# Patient Record
Sex: Female | Born: 1975 | Race: White | Hispanic: No | Smoking: Current some day smoker
Health system: Southern US, Community
[De-identification: ages and names within clinical notes are randomized; demographics above are authoritative.]

## PROBLEM LIST (undated history)

## (undated) DIAGNOSIS — I10 Essential (primary) hypertension: Secondary | ICD-10-CM

## (undated) DIAGNOSIS — F419 Anxiety disorder, unspecified: Secondary | ICD-10-CM

## (undated) HISTORY — DX: Essential (primary) hypertension: I10

---

## 1998-02-28 ENCOUNTER — Inpatient Hospital Stay (HOSPITAL_COMMUNITY): Admission: AD | Admit: 1998-02-28 | Discharge: 1998-02-28 | Payer: Self-pay | Admitting: *Deleted

## 1998-03-24 ENCOUNTER — Other Ambulatory Visit: Admission: RE | Admit: 1998-03-24 | Discharge: 1998-03-24 | Payer: Self-pay | Admitting: Obstetrics and Gynecology

## 1998-08-10 ENCOUNTER — Ambulatory Visit (HOSPITAL_COMMUNITY): Admission: RE | Admit: 1998-08-10 | Discharge: 1998-08-10 | Payer: Self-pay | Admitting: Obstetrics and Gynecology

## 1998-09-29 ENCOUNTER — Inpatient Hospital Stay (HOSPITAL_COMMUNITY): Admission: AD | Admit: 1998-09-29 | Discharge: 1998-09-29 | Payer: Self-pay | Admitting: Obstetrics and Gynecology

## 1998-09-30 ENCOUNTER — Inpatient Hospital Stay (HOSPITAL_COMMUNITY): Admission: AD | Admit: 1998-09-30 | Discharge: 1998-09-30 | Payer: Self-pay | Admitting: Obstetrics and Gynecology

## 1998-10-01 ENCOUNTER — Inpatient Hospital Stay (HOSPITAL_COMMUNITY): Admission: AD | Admit: 1998-10-01 | Discharge: 1998-10-04 | Payer: Self-pay | Admitting: Obstetrics and Gynecology

## 1999-05-09 ENCOUNTER — Inpatient Hospital Stay (HOSPITAL_COMMUNITY): Admission: AD | Admit: 1999-05-09 | Discharge: 1999-05-09 | Payer: Self-pay | Admitting: Obstetrics and Gynecology

## 1999-05-14 ENCOUNTER — Other Ambulatory Visit: Admission: RE | Admit: 1999-05-14 | Discharge: 1999-05-14 | Payer: Self-pay | Admitting: Obstetrics and Gynecology

## 1999-06-25 ENCOUNTER — Ambulatory Visit (HOSPITAL_COMMUNITY): Admission: RE | Admit: 1999-06-25 | Discharge: 1999-06-25 | Payer: Self-pay | Admitting: Obstetrics and Gynecology

## 1999-06-25 ENCOUNTER — Encounter: Payer: Self-pay | Admitting: Obstetrics and Gynecology

## 1999-10-31 ENCOUNTER — Inpatient Hospital Stay (HOSPITAL_COMMUNITY): Admission: AD | Admit: 1999-10-31 | Discharge: 1999-10-31 | Payer: Self-pay | Admitting: Obstetrics and Gynecology

## 1999-11-07 ENCOUNTER — Inpatient Hospital Stay (HOSPITAL_COMMUNITY): Admission: AD | Admit: 1999-11-07 | Discharge: 1999-11-07 | Payer: Self-pay | Admitting: Obstetrics and Gynecology

## 1999-11-15 ENCOUNTER — Inpatient Hospital Stay (HOSPITAL_COMMUNITY): Admission: AD | Admit: 1999-11-15 | Discharge: 1999-11-17 | Payer: Self-pay | Admitting: Obstetrics and Gynecology

## 2002-04-06 ENCOUNTER — Encounter: Payer: Self-pay | Admitting: Emergency Medicine

## 2002-04-06 ENCOUNTER — Emergency Department (HOSPITAL_COMMUNITY): Admission: EM | Admit: 2002-04-06 | Discharge: 2002-04-06 | Payer: Self-pay | Admitting: Emergency Medicine

## 2002-09-26 ENCOUNTER — Emergency Department (HOSPITAL_COMMUNITY): Admission: EM | Admit: 2002-09-26 | Discharge: 2002-09-26 | Payer: Self-pay | Admitting: Emergency Medicine

## 2004-02-14 ENCOUNTER — Emergency Department (HOSPITAL_COMMUNITY): Admission: EM | Admit: 2004-02-14 | Discharge: 2004-02-14 | Payer: Self-pay | Admitting: Emergency Medicine

## 2004-12-18 ENCOUNTER — Emergency Department (HOSPITAL_COMMUNITY): Admission: EM | Admit: 2004-12-18 | Discharge: 2004-12-18 | Payer: Self-pay | Admitting: Family Medicine

## 2005-03-11 ENCOUNTER — Emergency Department (HOSPITAL_COMMUNITY): Admission: EM | Admit: 2005-03-11 | Discharge: 2005-03-11 | Payer: Self-pay | Admitting: Family Medicine

## 2005-05-19 ENCOUNTER — Emergency Department (HOSPITAL_COMMUNITY): Admission: EM | Admit: 2005-05-19 | Discharge: 2005-05-19 | Payer: Self-pay | Admitting: *Deleted

## 2005-08-05 ENCOUNTER — Other Ambulatory Visit: Admission: RE | Admit: 2005-08-05 | Discharge: 2005-08-05 | Payer: Self-pay | Admitting: Obstetrics and Gynecology

## 2005-08-26 ENCOUNTER — Emergency Department (HOSPITAL_COMMUNITY): Admission: EM | Admit: 2005-08-26 | Discharge: 2005-08-26 | Payer: Self-pay | Admitting: Podiatry

## 2005-12-02 ENCOUNTER — Emergency Department (HOSPITAL_COMMUNITY): Admission: EM | Admit: 2005-12-02 | Discharge: 2005-12-03 | Payer: Self-pay | Admitting: Emergency Medicine

## 2006-01-15 ENCOUNTER — Emergency Department (HOSPITAL_COMMUNITY): Admission: EM | Admit: 2006-01-15 | Discharge: 2006-01-15 | Payer: Self-pay | Admitting: Emergency Medicine

## 2006-10-02 ENCOUNTER — Emergency Department (HOSPITAL_COMMUNITY): Admission: EM | Admit: 2006-10-02 | Discharge: 2006-10-02 | Payer: Self-pay | Admitting: Emergency Medicine

## 2006-11-06 ENCOUNTER — Emergency Department (HOSPITAL_COMMUNITY): Admission: EM | Admit: 2006-11-06 | Discharge: 2006-11-06 | Payer: Self-pay | Admitting: Emergency Medicine

## 2007-07-03 ENCOUNTER — Encounter: Admission: RE | Admit: 2007-07-03 | Discharge: 2007-07-03 | Payer: Self-pay | Admitting: General Practice

## 2007-07-05 ENCOUNTER — Emergency Department (HOSPITAL_COMMUNITY): Admission: EM | Admit: 2007-07-05 | Discharge: 2007-07-05 | Payer: Self-pay | Admitting: Emergency Medicine

## 2007-08-31 ENCOUNTER — Emergency Department (HOSPITAL_COMMUNITY): Admission: EM | Admit: 2007-08-31 | Discharge: 2007-08-31 | Payer: Self-pay | Admitting: Emergency Medicine

## 2009-03-07 ENCOUNTER — Emergency Department (HOSPITAL_COMMUNITY): Admission: EM | Admit: 2009-03-07 | Discharge: 2009-03-07 | Payer: Self-pay | Admitting: Emergency Medicine

## 2010-01-12 ENCOUNTER — Emergency Department (HOSPITAL_COMMUNITY): Admission: EM | Admit: 2010-01-12 | Discharge: 2010-01-12 | Payer: Self-pay | Admitting: Emergency Medicine

## 2010-03-15 ENCOUNTER — Emergency Department (HOSPITAL_COMMUNITY): Admission: EM | Admit: 2010-03-15 | Discharge: 2010-03-15 | Payer: Self-pay | Admitting: Family Medicine

## 2010-07-19 ENCOUNTER — Emergency Department (HOSPITAL_COMMUNITY)
Admission: EM | Admit: 2010-07-19 | Discharge: 2010-07-19 | Payer: Self-pay | Source: Home / Self Care | Admitting: Emergency Medicine

## 2010-10-08 LAB — URINALYSIS, ROUTINE W REFLEX MICROSCOPIC
Ketones, ur: 15 mg/dL — AB
Specific Gravity, Urine: 1.016 (ref 1.005–1.030)
pH: 5.5 (ref 5.0–8.0)

## 2010-10-08 LAB — URINE MICROSCOPIC-ADD ON

## 2010-10-14 LAB — HEPATIC FUNCTION PANEL
AST: 18 U/L (ref 0–37)
Albumin: 4.1 g/dL (ref 3.5–5.2)
Bilirubin, Direct: 0.1 mg/dL (ref 0.0–0.3)
Total Bilirubin: 1.1 mg/dL (ref 0.3–1.2)
Total Protein: 7.6 g/dL (ref 6.0–8.3)

## 2010-10-14 LAB — URINALYSIS, ROUTINE W REFLEX MICROSCOPIC
Nitrite: NEGATIVE
Specific Gravity, Urine: 1.017 (ref 1.005–1.030)
Urobilinogen, UA: 0.2 mg/dL (ref 0.0–1.0)

## 2010-10-14 LAB — CBC
HCT: 43.3 % (ref 36.0–46.0)
Hemoglobin: 14.4 g/dL (ref 12.0–15.0)
MCHC: 33.3 g/dL (ref 30.0–36.0)
MCV: 97.2 fL (ref 78.0–100.0)
RDW: 13 % (ref 11.5–15.5)
WBC: 11 10*3/uL — ABNORMAL HIGH (ref 4.0–10.5)

## 2010-10-14 LAB — RAPID URINE DRUG SCREEN, HOSP PERFORMED
Amphetamines: NOT DETECTED
Barbiturates: NOT DETECTED
Cocaine: POSITIVE — AB
Tetrahydrocannabinol: POSITIVE — AB

## 2010-10-14 LAB — POCT PREGNANCY, URINE: Preg Test, Ur: NEGATIVE

## 2010-10-14 LAB — BASIC METABOLIC PANEL
CO2: 27 mEq/L (ref 19–32)
Calcium: 9.4 mg/dL (ref 8.4–10.5)
Chloride: 105 mEq/L (ref 96–112)
GFR calc non Af Amer: >60 mL/min (ref >60)
Potassium: 4.2 mEq/L (ref 3.5–5.1)
Sodium: 138 mEq/L (ref 135–145)

## 2010-10-14 LAB — WET PREP, GENITAL
Trich, Wet Prep: NONE SEEN
Yeast Wet Prep HPF POC: NONE SEEN

## 2010-10-14 LAB — GC/CHLAMYDIA PROBE AMP, GENITAL: Chlamydia, DNA Probe: NEGATIVE

## 2010-10-14 LAB — DIFFERENTIAL
Eosinophils Relative: 1 % (ref 0–5)
Neutrophils Relative %: 83 % — ABNORMAL HIGH (ref 43–77)

## 2010-10-14 LAB — URINE MICROSCOPIC-ADD ON

## 2010-10-24 ENCOUNTER — Emergency Department (HOSPITAL_COMMUNITY): Payer: Self-pay

## 2010-10-24 ENCOUNTER — Emergency Department (HOSPITAL_COMMUNITY)
Admission: EM | Admit: 2010-10-24 | Discharge: 2010-10-24 | Disposition: A | Discharge status: Home / Self Care | Payer: Self-pay | Attending: Emergency Medicine | Admitting: Emergency Medicine

## 2010-10-24 DIAGNOSIS — R109 Unspecified abdominal pain: Secondary | ICD-10-CM | POA: Insufficient documentation

## 2010-10-24 LAB — DIFFERENTIAL
Lymphocytes Relative: 24 % (ref 12–46)
Lymphs Abs: 1.7 10*3/uL (ref 0.7–4.0)
Monocytes Absolute: 0.7 10*3/uL (ref 0.1–1.0)
Monocytes Relative: 10 % (ref 3–12)
Neutro Abs: 4.3 10*3/uL (ref 1.7–7.7)
Neutrophils Relative %: 62 % (ref 43–77)

## 2010-10-24 LAB — URINALYSIS, ROUTINE W REFLEX MICROSCOPIC
Bilirubin Urine: NEGATIVE
Ketones, ur: NEGATIVE mg/dL
Protein, ur: NEGATIVE mg/dL
Specific Gravity, Urine: 1.021 (ref 1.005–1.030)
Urobilinogen, UA: 0.2 mg/dL (ref 0.0–1.0)

## 2010-10-24 LAB — COMPREHENSIVE METABOLIC PANEL
ALT: 13 U/L (ref 0–35)
AST: 16 U/L (ref 0–37)
Albumin: 3.4 g/dL — ABNORMAL LOW (ref 3.5–5.2)
Alkaline Phosphatase: 43 U/L (ref 39–117)
Chloride: 104 mEq/L (ref 96–112)
GFR calc non Af Amer: >60 mL/min (ref >60)
Sodium: 139 mEq/L (ref 135–145)
Total Bilirubin: 0.5 mg/dL (ref 0.3–1.2)

## 2010-10-24 LAB — CBC
Hemoglobin: 12.6 g/dL (ref 12.0–15.0)
MCH: 30.9 pg (ref 26.0–34.0)
MCV: 93.6 fL (ref 78.0–100.0)

## 2010-10-24 LAB — POCT PREGNANCY, URINE: Preg Test, Ur: NEGATIVE

## 2010-11-03 LAB — URINALYSIS, ROUTINE W REFLEX MICROSCOPIC
Bilirubin Urine: NEGATIVE
Hgb urine dipstick: NEGATIVE
Nitrite: NEGATIVE
Protein, ur: NEGATIVE mg/dL
Specific Gravity, Urine: 1.027 (ref 1.005–1.030)
Urobilinogen, UA: 0.2 mg/dL (ref 0.0–1.0)

## 2010-11-03 LAB — COMPREHENSIVE METABOLIC PANEL
ALT: 14 U/L (ref 0–35)
AST: 17 U/L (ref 0–37)
CO2: 26 mEq/L (ref 19–32)
Calcium: 9.2 mg/dL (ref 8.4–10.5)
Creatinine, Ser: 1.08 mg/dL (ref 0.4–1.2)
GFR calc Af Amer: >60 mL/min (ref >60)
GFR calc non Af Amer: 59 mL/min — ABNORMAL LOW (ref >60)
Sodium: 137 mEq/L (ref 135–145)
Total Protein: 7.3 g/dL (ref 6.0–8.3)

## 2010-11-03 LAB — DIFFERENTIAL
Eosinophils Relative: 2 % (ref 0–5)
Lymphocytes Relative: 14 % (ref 12–46)
Lymphs Abs: 1.5 10*3/uL (ref 0.7–4.0)
Monocytes Relative: 5 % (ref 3–12)

## 2010-11-03 LAB — GC/CHLAMYDIA PROBE AMP, GENITAL
Chlamydia, DNA Probe: NEGATIVE
GC Probe Amp, Genital: NEGATIVE

## 2010-11-03 LAB — CBC
MCHC: 33.8 g/dL (ref 30.0–36.0)
MCV: 98.5 fL (ref 78.0–100.0)
Platelets: 265 10*3/uL (ref 150–400)
RDW: 13.7 % (ref 11.5–15.5)

## 2010-11-03 LAB — PREGNANCY, URINE: Preg Test, Ur: NEGATIVE

## 2010-12-14 NOTE — Discharge Summary (Signed)
[facility]  Patient:    SORCHA, ROTUNNO                    MRN: [id number] Adm. Date:  [id number] Disc. Date: [id number] Attending:  Oliver Pila                           Discharge Summary  HISTORY OF PRESENT ILLNESS:   A 35 year old white female, para 1-0-2-1, gravida 4, EDC Nov 27, 1999, by last menstrual period compatible with a sonogram at 10 weeks, who presented to labor and delivery with contractions every 10 minutes all day, no leakage of fluid, no vaginal bleeding, and good fetal movement.  PRENATAL LABORATORY DATA:     A positive with a negative antibody, RPR nonreactive, rubella equivocal, hepatitis B surface antigen negative, HIV negative, GC and Chlamydia negative, Group B Strep negative.  One-hour Glucola 83.  PAST OBSTETRIC HISTORY:       Terminations of pregnancy in 1995 and 1997.  In March 2000, spontaneous vaginal delivery 8 pounds 1 ounce.  MEDICATIONS:                  Prenatal vitamins.  PAST GYN HISTORY:             CIN-1 on colposcopic examination.  ALLERGIES:                    PERCOCET caused itching.  PAST MEDICAL HISTORY: PAST SURGICAL HISTORY:        Negative.  PHYSICAL EXAMINATION:         VITAL SIGNS: Normal.  HEART: Normal.  LUNGS: Clear. ABDOMEN: Soft and nontender.  PELVIC: Cervix 4 cm, 80%, vertex -1. Artificial rupture of membranes was done with clear fluid.  HOSPITAL COURSE:              The patient progressed to 8 cm.  She then progressed to full dilatation, pushed two times with a spontaneous vaginal delivery of a vigorous female over intact perineum by Alvino Chapel, M.D., 7 pounds 1  ounce.  Apgars 8 and 9 at one and five minutes.  Placenta was delivered spontaneously.  Estimated blood loss was 300 cc.  Cervix and rectum intact. Postpartum, the patient did well and was discharged on the second postpartum day. Initial hemoglobin 13.3, hematocrit 36.8, and platelet count 173,000,  white count 13,000.  Follow-up hemoglobin 12.1, hematocrit 33.3, white count 21,600, and platelet count 175,000.  RPR nonreactive.  The patients prenatal labs showed an  equivocal rubella.  Hospital plans to offer her the rubella vaccine and the patient will elect to use or not use the rubella vaccine.  FINAL DIAGNOSES:              Intrauterine pregnancy at 38 weeks, delivered.  PROCEDURE:                    Spontaneous vaginal delivery.  CONDITION ON DISCHARGE:       Improved.  DISCHARGE INSTRUCTIONS:       Regular discharge instruction booklet.  Return to the office in six weeks.  She is given a prescription for Darvocet N-100 12 tablets one every six hours as needed for pain with one refill and Motrin 800 mg 20 tablets one every eight hours as needed for pain with one refill. DD:  11/18/99 TD:  11/19/99 Job: [id number]

## 2011-08-06 ENCOUNTER — Emergency Department (HOSPITAL_COMMUNITY)
Admission: EM | Admit: 2011-08-06 | Discharge: 2011-08-06 | Disposition: A | Discharge status: Home / Self Care | Payer: No Typology Code available for payment source | Attending: Emergency Medicine | Admitting: Emergency Medicine

## 2011-08-06 ENCOUNTER — Encounter: Payer: Self-pay | Admitting: Emergency Medicine

## 2011-08-06 ENCOUNTER — Emergency Department (HOSPITAL_COMMUNITY): Payer: No Typology Code available for payment source

## 2011-08-06 DIAGNOSIS — S7011XA Contusion of right thigh, initial encounter: Secondary | ICD-10-CM

## 2011-08-06 DIAGNOSIS — M25569 Pain in unspecified knee: Secondary | ICD-10-CM | POA: Insufficient documentation

## 2011-08-06 DIAGNOSIS — R0789 Other chest pain: Secondary | ICD-10-CM

## 2011-08-06 DIAGNOSIS — R51 Headache: Secondary | ICD-10-CM | POA: Insufficient documentation

## 2011-08-06 DIAGNOSIS — M79609 Pain in unspecified limb: Secondary | ICD-10-CM | POA: Insufficient documentation

## 2011-08-06 DIAGNOSIS — R071 Chest pain on breathing: Secondary | ICD-10-CM | POA: Insufficient documentation

## 2011-08-06 DIAGNOSIS — S7010XA Contusion of unspecified thigh, initial encounter: Secondary | ICD-10-CM | POA: Insufficient documentation

## 2011-08-06 DIAGNOSIS — M7989 Other specified soft tissue disorders: Secondary | ICD-10-CM | POA: Insufficient documentation

## 2011-08-06 MED ORDER — KETOROLAC TROMETHAMINE 60 MG/2ML IM SOLN
60.0000 mg | Freq: Once | INTRAMUSCULAR | Status: AC
  Administered 2011-08-06: 60 mg via INTRAMUSCULAR
  Filled 2011-08-06: qty 2

## 2011-08-06 MED ORDER — OXYCODONE-ACETAMINOPHEN 5-325 MG PO TABS
2.0000 | ORAL_TABLET | Freq: Once | ORAL | Status: AC
  Administered 2011-08-06: 2 via ORAL
  Filled 2011-08-06: qty 2

## 2011-08-06 MED ORDER — HYDROCODONE-ACETAMINOPHEN 5-325 MG PO TABS: 1.0000 | ORAL_TABLET | ORAL | Status: DC | PRN

## 2011-08-06 NOTE — ED Provider Notes (Deleted)
6:44 PM Patient not found in room.  Nursing staff checked in waiting room, patient not found.  May have eloped.  Jimmye Norman, NP 08/06/11 1844
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
## 2011-08-06 NOTE — ED Notes (Signed)
No answer x1
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
## 2011-08-06 NOTE — ED Provider Notes (Signed)
Medical screening examination/treatment/procedure(s) were performed by non-physician practitioner and as supervising physician I was immediately available for consultation/collaboration.   Cassiel Fernandez, MD 08/06/11 2044 
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
## 2011-08-07 NOTE — ED Provider Notes (Signed)
Medical screening examination/treatment/procedure(s) were performed by non-physician practitioner and as supervising physician I was immediately available for consultation/collaboration.   Dione Booze, MD 08/07/11 [phone]

## 2011-08-11 ENCOUNTER — Emergency Department (HOSPITAL_COMMUNITY): Payer: No Typology Code available for payment source

## 2011-08-11 ENCOUNTER — Encounter (HOSPITAL_COMMUNITY): Payer: Self-pay | Admitting: *Deleted

## 2011-08-11 ENCOUNTER — Emergency Department (HOSPITAL_COMMUNITY)
Admission: EM | Admit: 2011-08-11 | Discharge: 2011-08-11 | Disposition: A | Discharge status: Home / Self Care | Payer: No Typology Code available for payment source | Attending: Emergency Medicine | Admitting: Emergency Medicine

## 2011-08-11 DIAGNOSIS — F17200 Nicotine dependence, unspecified, uncomplicated: Secondary | ICD-10-CM | POA: Insufficient documentation

## 2011-08-11 DIAGNOSIS — R071 Chest pain on breathing: Secondary | ICD-10-CM | POA: Insufficient documentation

## 2011-08-11 DIAGNOSIS — R0789 Other chest pain: Secondary | ICD-10-CM

## 2011-08-11 MED ORDER — HYDROMORPHONE HCL PF 2 MG/ML IJ SOLN
2.0000 mg | Freq: Once | INTRAMUSCULAR | Status: AC
  Administered 2011-08-11: 2 mg via INTRAMUSCULAR
  Filled 2011-08-11: qty 1

## 2011-08-11 MED ORDER — HYDROCODONE-ACETAMINOPHEN 5-325 MG PO TABS: 1.0000 | ORAL_TABLET | ORAL | Status: AC | PRN

## 2011-08-11 MED ORDER — ONDANSETRON HCL 8 MG PO TABS: 8.0000 mg | ORAL_TABLET | ORAL | Status: AC | PRN
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
## 2011-08-11 NOTE — ED Notes (Signed)
MD at bedside. 
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
## 2012-08-25 ENCOUNTER — Emergency Department (HOSPITAL_COMMUNITY)
Admission: EM | Admit: 2012-08-25 | Discharge: 2012-08-25 | Disposition: A | Discharge status: Home / Self Care | Payer: Self-pay | Attending: Emergency Medicine | Admitting: Emergency Medicine

## 2012-08-25 ENCOUNTER — Emergency Department (HOSPITAL_COMMUNITY): Payer: Self-pay

## 2012-08-25 ENCOUNTER — Encounter (HOSPITAL_COMMUNITY): Payer: Self-pay

## 2012-08-25 DIAGNOSIS — S0300XA Dislocation of jaw, unspecified side, initial encounter: Secondary | ICD-10-CM | POA: Insufficient documentation

## 2012-08-25 DIAGNOSIS — F411 Generalized anxiety disorder: Secondary | ICD-10-CM | POA: Insufficient documentation

## 2012-08-25 DIAGNOSIS — Y9389 Activity, other specified: Secondary | ICD-10-CM | POA: Insufficient documentation

## 2012-08-25 DIAGNOSIS — Z3202 Encounter for pregnancy test, result negative: Secondary | ICD-10-CM | POA: Insufficient documentation

## 2012-08-25 DIAGNOSIS — Y9229 Other specified public building as the place of occurrence of the external cause: Secondary | ICD-10-CM | POA: Insufficient documentation

## 2012-08-25 DIAGNOSIS — W07XXXA Fall from chair, initial encounter: Secondary | ICD-10-CM | POA: Insufficient documentation

## 2012-08-25 DIAGNOSIS — F17200 Nicotine dependence, unspecified, uncomplicated: Secondary | ICD-10-CM | POA: Insufficient documentation

## 2012-08-25 DIAGNOSIS — R569 Unspecified convulsions: Secondary | ICD-10-CM | POA: Insufficient documentation

## 2012-08-25 DIAGNOSIS — Z79899 Other long term (current) drug therapy: Secondary | ICD-10-CM | POA: Insufficient documentation

## 2012-08-25 HISTORY — DX: Anxiety disorder, unspecified: F41.9

## 2012-08-25 LAB — CBC
Hemoglobin: 14.3 g/dL (ref 12.0–15.0)
MCV: 90.6 fL (ref 78.0–100.0)
Platelets: 214 10*3/uL (ref 150–400)
RBC: 4.48 MIL/uL (ref 3.87–5.11)
WBC: 8.7 10*3/uL (ref 4.0–10.5)

## 2012-08-25 LAB — RAPID URINE DRUG SCREEN, HOSP PERFORMED
Amphetamines: NOT DETECTED
Barbiturates: NOT DETECTED
Tetrahydrocannabinol: POSITIVE — AB

## 2012-08-25 LAB — BASIC METABOLIC PANEL
CO2: 24 mEq/L (ref 19–32)
Chloride: 103 mEq/L (ref 96–112)
Glucose, Bld: 105 mg/dL — ABNORMAL HIGH (ref 70–99)
Sodium: 138 mEq/L (ref 135–145)

## 2012-08-25 LAB — GLUCOSE, CAPILLARY: Glucose-Capillary: 102 mg/dL — ABNORMAL HIGH (ref 70–99)

## 2012-08-25 LAB — POCT PREGNANCY, URINE: Preg Test, Ur: NEGATIVE

## 2012-08-25 MED ORDER — PROPOFOL 10 MG/ML IV BOLUS
INTRAVENOUS | Status: DC | PRN
  Administered 2012-08-25: 40 mg via INTRAVENOUS

## 2012-08-25 MED ORDER — OXYCODONE-ACETAMINOPHEN 5-325 MG PO TABS: 1.0000 | ORAL_TABLET | ORAL | Status: DC | PRN

## 2012-08-25 MED ORDER — SODIUM CHLORIDE 0.9 % IV BOLUS (SEPSIS)
1000.0000 mL | Freq: Once | INTRAVENOUS | Status: AC
  Administered 2012-08-25: 1000 mL via INTRAVENOUS

## 2012-08-25 MED ORDER — PROPOFOL 10 MG/ML IV BOLUS: 0.5000 mg/kg | Freq: Once | INTRAVENOUS | Status: DC

## 2012-08-25 MED ORDER — PROPOFOL 10 MG/ML IV BOLUS
INTRAVENOUS | Status: AC | PRN
  Administered 2012-08-25: 40 mg via INTRAVENOUS

## 2012-08-25 MED ORDER — OXYCODONE-ACETAMINOPHEN 5-325 MG PO TABS
1.0000 | ORAL_TABLET | Freq: Once | ORAL | Status: AC
  Administered 2012-08-25: 1 via ORAL
  Filled 2012-08-25: qty 1

## 2012-08-25 MED ORDER — LORAZEPAM 2 MG/ML IJ SOLN
1.0000 mg | Freq: Once | INTRAMUSCULAR | Status: AC
  Administered 2012-08-25: 1 mg via INTRAVENOUS
  Filled 2012-08-25: qty 1

## 2012-08-25 MED ORDER — PROPOFOL 10 MG/ML IV BOLUS
INTRAVENOUS | Status: AC
  Filled 2012-08-25: qty 40

## 2012-08-25 MED ORDER — ALPRAZOLAM 1 MG PO TABS: 1.0000 mg | ORAL_TABLET | Freq: Every day | ORAL | Status: DC | PRN

## 2012-08-25 MED ORDER — HYDROMORPHONE HCL PF 1 MG/ML IJ SOLN
1.0000 mg | Freq: Once | INTRAMUSCULAR | Status: AC
  Administered 2012-08-25: 1 mg via INTRAVENOUS
  Filled 2012-08-25: qty 1

## 2012-08-25 NOTE — ED Notes (Signed)
Pt reports weight 150 pounds

## 2012-08-25 NOTE — ED Notes (Signed)
Patient transported to CT 
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
## 2012-08-25 NOTE — ED Notes (Signed)
Pt ambulates with steady gate
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
## 2012-08-25 NOTE — ED Notes (Signed)
CBG 102 

## 2012-08-26 NOTE — ED Provider Notes (Signed)
I saw and evaluated the patient, reviewed the resident's note and I agree with the findings and plan. I have reviewed EKG and agree with the resident interpretation.   Pt with first time sz and came in with evidence of jaw dislocation.  Pt has on other evidence of injury. States usually takes xanax but did not have any today.  Normal scans and CT with jaw subluxation.  Pt sedated with propofol and jaw relocated.  Pt to open and shut mouth after event.  Labs normal and pt d/ced home with neuro consult.  Gwyneth Sprout, MD 08/26/12 [phone]

## 2013-04-30 ENCOUNTER — Ambulatory Visit (INDEPENDENT_AMBULATORY_CARE_PROVIDER_SITE_OTHER): Payer: Medicaid Other | Admitting: Surgery

## 2013-04-30 ENCOUNTER — Telehealth (INDEPENDENT_AMBULATORY_CARE_PROVIDER_SITE_OTHER): Payer: Self-pay | Admitting: Surgery

## 2013-04-30 ENCOUNTER — Encounter (INDEPENDENT_AMBULATORY_CARE_PROVIDER_SITE_OTHER): Payer: Self-pay | Admitting: Surgery

## 2013-04-30 ENCOUNTER — Inpatient Hospital Stay (HOSPITAL_COMMUNITY): Admission: AD | Admit: 2013-04-30 | Payer: No Typology Code available for payment source | Source: Ambulatory Visit

## 2013-04-30 VITALS — BP 104/60 | HR 72 | Resp 18 | Ht 71.0 in | Wt 170.0 lb

## 2013-04-30 DIAGNOSIS — K420 Umbilical hernia with obstruction, without gangrene: Secondary | ICD-10-CM | POA: Insufficient documentation

## 2013-04-30 NOTE — H&P (Signed)
Sheena Gross is an 37 y.o. female.   Chief Complaint: Abdominal pain nausea and vomiting HPI: This is a 37 year old female who is a self-referral to our office.  She has had a known umbilical hernia for many years. She reports that she was doing heavy lifting approximately a week ago. Since then she's been having increasing abdominal pain, nausea, and vomiting. She reports she is slightly constipated but moving her bowels daily. The pain as sharp and severe and located mostly at the umbilicus. She reports some blood in her stool. There is no hematemesis.  Past Medical History  Diagnosis Date  . Anxiety   . Hypertension     History reviewed. No pertinent past surgical history.  History reviewed. No pertinent family history. Social History:  reports that she quit smoking about 6 months ago. She does not have any smokeless tobacco history on file. She reports that she does not drink alcohol or use illicit drugs.  Allergies:  Allergies  Allergen Reactions  . Codeine Itching and Nausea And Vomiting    Tylenol #3     (Not in a hospital admission)  No results found for this or any previous visit (from the past 48 hour(s)). No results found.  Review of Systems  All other systems reviewed and are negative.    Blood pressure 104/60, pulse 72, resp. rate 18, height 5\' 11"  (1.803 m), weight 170 lb (77.111 kg). Physical Exam  Constitutional: She is oriented to person, place, and time. She appears well-developed and well-nourished. She appears distressed.  Anxious and crying  HENT:  Head: Normocephalic and atraumatic.  Right Ear: External ear normal.  Left Ear: External ear normal.  Nose: Nose normal.  Mouth/Throat: Oropharynx is clear and moist.  Eyes: Conjunctivae are normal. Pupils are equal, round, and reactive to light. Right eye exhibits no discharge. Left eye exhibits no discharge. No scleral icterus.  Neck: Normal range of motion. Neck supple. No tracheal deviation present.   Cardiovascular: Normal rate, regular rhythm, normal heart sounds and intact distal pulses.   Respiratory: Effort normal and breath sounds normal. No respiratory distress. She has no wheezes.  GI: Soft. Bowel sounds are normal. There is tenderness. There is guarding.  There is an incarcerated umbilical hernia. It is tender. It is quite small. The rest of her abdomen is completely soft and nontender and nondistended  Musculoskeletal: Normal range of motion.  Lymphadenopathy:    She has no cervical adenopathy.  Neurological: She is alert and oriented to person, place, and time.  Skin: Skin is warm and dry. No rash noted. No erythema.  Psychiatric:  Anxious and crying     Assessment/Plan Incarcerated umbilical hernia  Her symptoms seem too far outweigh the size of this hernia and the fact this hernia is been present for some time. Nonetheless, because of her tenderness and symptoms she will need admission to the hospital for IV rehydration and a CAT scan of her abdomen and pelvis to see if there is some other source rather than a hernia of the symptoms. I actually also had Dr. Ezzard Standing who is on call tonight examine her. He will follow up with a CAT scan and plan the further course of action  Sheena Gross A 04/30/2013, 4:05 PM

## 2013-04-30 NOTE — Telephone Encounter (Signed)
The patient never showed up at the hospital so I gave her a call.  She was doing better.  She will check with our office next week about possibly scheduling an abdominal/pelvic CT scan.  Odd case.  DN

## 2013-04-30 NOTE — Progress Notes (Signed)
Subjective:     Patient ID: Sheena Gross, female   DOB: 1975-10-05, 37 y.o.   MRN: [id number]  HPI This patient presents as a self-referral for incarcerated umbilical hernia.  Review of Systems     Objective:   Physical Exam She has an incarcerated umbilical hernia on exam    Assessment:     Incarcerated the hernia     Plan:     I am admitting her to the hospital. Please see my history and physical for a complete detail of the events

## 2013-05-03 ENCOUNTER — Telehealth (INDEPENDENT_AMBULATORY_CARE_PROVIDER_SITE_OTHER): Payer: Self-pay | Admitting: General Surgery

## 2013-05-03 ENCOUNTER — Other Ambulatory Visit (INDEPENDENT_AMBULATORY_CARE_PROVIDER_SITE_OTHER): Payer: Self-pay | Admitting: Surgery

## 2013-05-03 DIAGNOSIS — R109 Unspecified abdominal pain: Secondary | ICD-10-CM

## 2013-05-03 NOTE — Telephone Encounter (Signed)
Might as well get an outpatient CT scan on her to make sure nothing crazy is going on

## 2013-05-03 NOTE — Telephone Encounter (Signed)
Called patient this morning and I asked her why she did not go to the ED on Friday. She stated that she stop by [facility] on her way to the ED and had a Big BM and she felt better and she got scared about going to the ED, so she went home. She stated that she wanted a CT Scan and I told her Per Dr Ezzard Standing that he did not see it necessary to get a CT. I asked her how she feeling this morning she stated that she was laying down but had a lot of BM this morning, no fever, and not having the pain that she was having on Friday. I told her that I have to talk to Dr Magnus Ivan this afternoon and I will ask him if he wants me to order a CT or have her to come back into the clinic, and I will call her back once I talk to him and she agree to that

## 2013-05-05 ENCOUNTER — Ambulatory Visit
Admission: RE | Admit: 2013-05-05 | Discharge: 2013-05-05 | Disposition: A | Discharge status: Home / Self Care | Payer: Medicaid Other | Source: Ambulatory Visit | Attending: Surgery | Admitting: Surgery

## 2013-05-05 DIAGNOSIS — R109 Unspecified abdominal pain: Secondary | ICD-10-CM

## 2013-05-05 MED ORDER — IOHEXOL 300 MG/ML  SOLN
100.0000 mL | Freq: Once | INTRAMUSCULAR | Status: AC | PRN
  Administered 2013-05-05: 100 mL via INTRAVENOUS

## 2013-05-06 ENCOUNTER — Encounter (INDEPENDENT_AMBULATORY_CARE_PROVIDER_SITE_OTHER): Payer: Self-pay | Admitting: Surgery

## 2013-05-06 ENCOUNTER — Ambulatory Visit (INDEPENDENT_AMBULATORY_CARE_PROVIDER_SITE_OTHER): Payer: Medicaid Other | Admitting: Surgery

## 2013-05-06 VITALS — BP 126/81 | HR 60 | Temp 97.4°F | Resp 12 | Ht 71.0 in | Wt 170.4 lb

## 2013-05-06 DIAGNOSIS — K420 Umbilical hernia with obstruction, without gangrene: Secondary | ICD-10-CM

## 2013-05-06 NOTE — Progress Notes (Signed)
Subjective:     Patient ID: Sheena Gross, female   DOB: Sep 21, 1975, 37 y.o.   MRN: [id number]  HPI She is here for a followup from last week. She presented with a chronically incarcerated umbilical hernia. She was so hysterical and complaining of abdominal pain last Friday that I have to admit her to the hospital. Instead of going to the hospital, she went to [facility] and 8 and had a bowel movement and reported that she felt better. She eventually agreed to a CAT scan of the abdomen and pelvis. She reports less discomfort today  Review of Systems     Objective:   Physical Exam On exam, she has a chronically incarcerated umbilical hernia. Her abdomen is soft and nontender  The CAT scan showed a small umbilical hernia containing only fat which is unchanged from a similar CAT scan in 2011. There was no other intra-abdominal pathology    Assessment:     Chronically incarcerated umbilical hernia     Plan:     I will to hold a repair of this she is to hysterical and has too much anxiety for other reasons.   I believe she may fabricated all her other symptoms. It is difficult to tell. Definitely, her complaints were outside of the realm of what is found on examination and a CT scan.  I encouraged her to call her primary care physician or return to the emergency department should her symptoms recur

## 2013-09-28 ENCOUNTER — Encounter (HOSPITAL_COMMUNITY): Payer: Self-pay | Admitting: Emergency Medicine

## 2013-09-28 ENCOUNTER — Emergency Department (HOSPITAL_COMMUNITY)
Admission: EM | Admit: 2013-09-28 | Discharge: 2013-09-28 | Disposition: A | Discharge status: Home / Self Care | Payer: Medicaid Other | Source: Home / Self Care | Attending: Emergency Medicine | Admitting: Emergency Medicine

## 2013-09-28 DIAGNOSIS — F419 Anxiety disorder, unspecified: Secondary | ICD-10-CM

## 2013-09-28 DIAGNOSIS — F411 Generalized anxiety disorder: Secondary | ICD-10-CM

## 2013-09-28 DIAGNOSIS — I10 Essential (primary) hypertension: Secondary | ICD-10-CM

## 2013-09-28 LAB — POCT I-STAT, CHEM 8
BUN: 9 mg/dL (ref 6–23)
CALCIUM ION: 1.23 mmol/L (ref 1.12–1.23)
CHLORIDE: 101 meq/L (ref 96–112)
Creatinine, Ser: 0.8 mg/dL (ref 0.50–1.10)
GLUCOSE: 91 mg/dL (ref 70–99)
HCT: 48 % — ABNORMAL HIGH (ref 36.0–46.0)
Hemoglobin: 16.3 g/dL — ABNORMAL HIGH (ref 12.0–15.0)
Potassium: 4.1 mEq/L (ref 3.7–5.3)
Sodium: 142 mEq/L (ref 137–147)
TCO2: 26 mmol/L (ref 0–100)

## 2013-09-28 MED ORDER — LISINOPRIL 20 MG PO TABS: 20.0000 mg | ORAL_TABLET | Freq: Every day | ORAL | Status: DC

## 2013-09-28 MED ORDER — CLONAZEPAM 1 MG PO TABS: 1.0000 mg | ORAL_TABLET | Freq: Two times a day (BID) | ORAL | Status: DC

## 2013-09-28 NOTE — Discharge Instructions (Signed)
Blood pressure over the ideal can put you at higher risk for stroke, heart disease, and kidney failure.  For this reason, it's important to try to get your blood pressure as close as possible to the ideal.  The ideal blood pressure is 120/80.  Blood pressures from 469-629 systolic over 52-84 diastolic are labeled as "prehypertension."  This means you are at higher risk of developing hypertension in the future.  Blood pressures in this range are not treated with medication, but lifestyle changes are recommended to prevent progression to hypertension.  Blood pressures of 132 and above systolic over 90 and above diastolic are classified as hypertension and are treated with medications.  Lifestyle changes which can benefit both prehypertension and hypertension include the following:   Salt and sodium restriction.  Weight loss.  Regular exercise.  Avoidance of tobacco.  Avoidance of excess alcohol.  The "D.A.S.H" diet.   People with hypertension and prehypertension should limit their salt intake to less than 1500 mg daily.  Reading the nutrition information on the label of many prepared foods can give you an idea of how much sodium you're consuming at each meal.  Remember that the most important number on the nutrition information is the serving size.  It may be smaller than you think.  Try to avoid adding extra salt at the table.  You may add small amounts of salt while cooking.  Remember that salt is an acquired taste and you may get used to a using a whole lot less salt than you are using now.  Using less salt lets the food's natural flavors come through.  You might want to consider using salt substitutes, potassium chloride, pepper, or blends of herbs and spices to enhance the flavor of your food.  Foods that contain the most salt include: processed meats (like ham, bacon, lunch meat, sausage, hot dogs, and breakfast meat), chips, pretzels, salted nuts, soups, salty snacks, canned foods, junk  food, fast food, restaurant food, mustard, pickles, pizza, popcorn, soy sauce, and worcestershire sauce--quite a list!  You might ask, "Is there anything I can eat?"  The answer is, "yes."  Fruits and vegetables are usually low in salt.  Fresh is better than frozen which is better than canned.  If you have canned vegetables, you can cut down on the salt content by rinsing them in tap water 3 times before cooking.     Weight loss is the second thing you can do to lower your blood pressure.  Getting to and maintaining ideal weight will often normalize your blood pressure and allow you to avoid medications, entirely, cut way down on your dosage of medications, or allow to wean off your meds.  (Note, this should only be done under the supervision of your primary care doctor.)  Of course, weight loss takes time and you may need to be on medication in the meantime.  You shoot for a body mass index of 20-25.  When you go to the urgent care or to your primary care doctor, they should calculate your BMI.  If you don't know what it is, ask.  You can calculate your BMI with the following formula:  Weight in pounds x 703/ (height in inches) x (height in inches).  There are many good diets out there: Weight Watchers and the D.A.S.H. Diet are the best, but often, just modifying a few factors can be helpful:  Don't skip meals, don't eat out, and keeping a food diary.  I do not recommend  fad diets or diet pills which often raise blood pressure.    Everyone should get regular exercise, but this is particularly important for people with high blood pressure.  Just about any exercise is good.  The only exercise which may be harmful is lifting extreme heavy weights.  I recommend moderate exercise such as walking for 30 minutes 5 days a week.  Going to the gym for a 50 minute workout 3 times a week is also good.  This amounts to 150 minutes of exercise weekly.   Anyone with high blood pressure should avoid any use of tobacco.   Tobacco use does not elevate blood pressure, but it increases the risk of heart disease and stroke.  If you are interested in quitting, discuss with your doctor how to quit.  If you are not interested in quitting, ask yourself, "What would my life be like in 10 years if I continue to smoke?"  "How will I know when it is time to quit?"  "How would my life be better if I were to quit."   Excess alcohol intake can raise the blood pressure.  The safe alcohol intake is 2 drinks or less per day for men and 1 drink per day or less for women.   There is a very good diet which I recommend that has been designed for people with blood pressure called the D.A.S.H. Diet (dietary approaches to stop hypertension).  It consists of fruits, vegetables, lean meats, low fat dairy, whole grains, nuts and seeds.  It is very low in salt and sodium.  It has also been found to have other beneficial health effects such as lowering cholesterol and helping lose weight.  It has been developed by the [employer] and can be downloaded from the internet without any cost. Just do a Programmer, multimedia on "D.A.S.H. Diet." or go the NIH website (GolfingPosters.tn).  There are also cookbooks and diet plans that can be gotten from [place] to help you with this diet.   Generalized Anxiety Disorder Generalized anxiety disorder (GAD) is a mental disorder. It interferes with life functions, including relationships, work, and school. GAD is different from normal anxiety, which everyone experiences at some point in their lives in response to specific life events and activities. Normal anxiety actually helps [place] prepare for and get through these life events and activities. Normal anxiety goes away after the event or activity is over.  GAD causes anxiety that is not necessarily related to specific events or activities. It also causes excess anxiety in proportion to specific events or activities. The anxiety associated with GAD is also difficult to  control. GAD can vary from mild to severe. People with severe GAD can have intense waves of anxiety with physical symptoms (panic attacks).  SYMPTOMS The anxiety and worry associated with GAD are difficult to control. This anxiety and worry are related to many life events and activities and also occur more days than not for 6 months or longer. People with GAD also have three or more of the following symptoms (one or more in children):  Restlessness.   Fatigue.  Difficulty concentrating.   Irritability.  Muscle tension.  Difficulty sleeping or unsatisfying sleep. DIAGNOSIS GAD is diagnosed through an assessment by your caregiver. Your caregiver will ask you questions aboutyour mood,physical symptoms, and events in your life. Your caregiver may ask you about your medical history and use of alcohol or drugs, including prescription medications. Your caregiver may also do a physical exam  and blood tests. Certain medical conditions and the use of certain substances can cause symptoms similar to those associated with GAD. Your caregiver may refer you to a mental health specialist for further evaluation. TREATMENT The following therapies are usually used to treat GAD:   Medication Antidepressant medication usually is prescribed for long-term daily control. Antianxiety medications may be added in severe cases, especially when panic attacks occur.   Talk therapy (psychotherapy) Certain types of talk therapy can be helpful in treating GAD by providing support, education, and guidance. A form of talk therapy called cognitive behavioral therapy can teach you healthy ways to think about and react to daily life events and activities.  Stress managementtechniques These include yoga, meditation, and exercise and can be very helpful when they are practiced regularly. A mental health specialist can help determine which treatment is best for you. Some people see improvement with one therapy. However,  other people require a combination of therapies. Document Released: 11/09/2012 Document Reviewed: 11/09/2012 [facility]ExitCare Patient Information 2014 [place].  Go to www.goodrx.com to look up your medications. This will give you a list of where you can find your prescriptions at the most affordable prices.   If you have no primary doctor, here are some resources that may be helpful:  Medicaid-accepting [facility] [place] Providers: - [facility]- [address]  [phone];   - [facility]- [address], [address] [phone]  - [facility]- [address], [address] [phone] [facility]- [facility]- [address] [place] [address]  [phone]  - Renaye RakersVeita Bland- [address] [phone]  Only accepts [place] patients       after they have her name applied to their card  -Dr. Jackie PlumGeorge Osei-Bonsu, [facility]. [address] [place] [address]    [place], [place] [address]  [phone]  Self Pay (no insurance) in [place]: - Sickle Cell Patients: Dr Willey BladeEric Dean, [facility] [address] [phone]  - Health [phone]  - Physician Referral Service- [phone]  - [facility]- [address], [place], [phone];  Monday to Friday, 9 a.m. - 7 p.m.; Saturday 9 a.m. to 1 p.m.  [facility] [place]- [address] [place], [place] [phone]  - [facility]- [address] [place] [address]      [phone] - [facility]- [address] [phone]  [facility], [address] [place]; [phone]; or [address] [place] [address], [place]; [phone].   [facility] [place] [place], [place] [place]; [phone]; Monday to Wednesday, 8:30 a.m. - 5 p.m.; Thursday, 8:30 a.m. - 8 p.m.  [facility], [address], [address], [place]; [phone]; Monday to  Friday, 8 a.m. - 4:30 p.m.   [facility], [place] [place], [phone]; first and third Saturday of the month, 9:30 a.m. - 12:30 p.m.  [facility], [address] [place], [phone]; second Saturday of the month, 9 a.m. -noon.  [facility] for children. For information, call [phone]; [phone]; or [phone].  Other agencies that provide inexpensive medical care:     [facility]  [phone]    [facility]  [phone]    [facility]  [phone] [address] [place] [phone]    [facility]  [phone]    [facility]  [phone], [phone]; or [phone].  Chronic Pain Problems Contact [facility]  [phone] Patients need to be referred by their primary care doctor.  [facility]  [facility]     [employer]                          [facility]. [address] [place]                       [address]      [address] [place] [address]   [phone] (After Hours)  General Information: Finding a doctor when you do not have health insurance can be tricky. Although you are not limited by an insurance plan, you are of course limited by her finances and how much but he can pay out of pocket.  What are your options if you don't have health insurance?   1) Find a Librarian, academic and Pay Out of Pocket Although you won't have to find out who is covered by your insurance plan, it is a good idea to ask around and get recommendations. You will then need to call the office and see if the doctor you have chosen will accept you as a new patient and what types of options they offer for patients who are self-pay. Some doctors offer discounts or will set up payment plans for their patients who do not have insurance, but you will need to ask so you aren't surprised when you get to your appointment.  2) Contact Your Local Health  Department Not all health departments have doctors that can see patients for sick visits, but many do, so it is worth a call to see if yours does. If you don't know where your local health department is, you can check in your phone book. The CDC also has a tool to help you locate your state's health department, and many state websites also have listings of all of their local health departments.  3) Find a Walk-in Clinic If your illness is not likely to be very severe or complicated, you may want to try a walk in clinic. These are popping up all over the country in pharmacies, drugstores, and shopping centers. They're usually staffed by nurse practitioners or physician assistants that have been trained to treat common illnesses and complaints. They're usually fairly quick and inexpensive. However, if you have serious medical issues or chronic medical problems, these are probably not your best option   Call [phone] for referral to a primary care doctor.

## 2013-09-28 NOTE — ED Notes (Signed)
Pt has been with out BP and anxiety meds for 2 1/2 wks.   States was having spotty vision and headaches.  Currently in between doctors just got insurance.

## 2013-09-28 NOTE — ED Provider Notes (Signed)
Chief Complaint   Chief Complaint  Patient presents with  . Medication Refill    History of Present Illness   Sheena Gross is a 38 year old female with hypertension and anxiety who presents tonight for refills on medications. She has had hypertension for about 4 years and has been on lisinopril 20 mg once a day for about 2 years. She's had no medication side effects. The medication usually keeps her blood pressure under good control. She is without a primary care physician right now and thus comes here for her refills. When she doesn't have her blood pressure medicine she sees spots, feels dizzy, and get short of breath and anxious. She also has chronic anxiety and is on Xanax. She is requesting 1 mg 3 times a day and she states that if she does not get these she will feel very anxious and short of breath. She denies any blurry vision, shortness of breath, chest pain, pressure, tightness, ankle edema, or strokelike symptoms. She does have some palpitations. She denies any history of diabetes, hypercholesterolemia, or kidney disease. She is trying to quit smoking and is using e-cigarettes. She tries to avoid salt and sodium.  Review of Systems   Other than as noted above, the patient denies any of the following symptoms: Respiratory:  No coughing, wheezing, or shortness of breath. Cardiac:  No chest pain, tightness, pressure, palpitations, syncope, or edema. Neuro:  No headache, dizziness, blurred vision, weakness, paresthesias, or strokelike symptoms.   PMFSH   Past medical history, family history, social history, meds, and allergies were reviewed.  She has a history of kidney stones.  Physical Examination   Vital signs:  BP 129/71  Pulse 61  Temp(Src) 97.7 F (36.5 C) (Oral)  Resp 16  SpO2 99%  LMP 09/21/2013 General:  Alert, oriented, in no distress. Lungs:  Breath sounds clear and equal bilaterally.  No wheezes, rales, or rhonchi. Heart:  Regular rhythm, no gallops,  murmers, clicks or rubs.  Abdomen:  Soft and flat.  Nontender, no organomegaly or mass.  No pulsatile midline abdominal mass or bruit. Ext:  No edema, pulses full. Neurological exam:  Alert and oriented.  Speech is clear.  No pronator drift.  CNs intact.  Labs   Results for orders placed during the hospital encounter of 09/28/13  POCT I-STAT, CHEM 8      Result Value Ref Range   Sodium 142  137 - 147 mEq/L   Potassium 4.1  3.7 - 5.3 mEq/L   Chloride 101  96 - 112 mEq/L   BUN 9  6 - 23 mg/dL   Creatinine, Ser 1.610.80  0.50 - 1.10 mg/dL   Glucose, Bld 91  70 - 99 mg/dL   Calcium, Ion 0.961.23  0.451.12 - 1.23 mmol/L   TCO2 26  0 - 100 mmol/L   Hemoglobin 16.3 (*) 12.0 - 15.0 g/dL   HCT 40.948.0 (*) 81.136.0 - 91.446.0 %    Assessment   The primary encounter diagnosis was Hypertension. A diagnosis of Anxiety was also pertinent to this visit.  I'm not comfortable giving her a refill on her Xanax. I did give her a small prescription for clonazepam.  Plan   1.  Meds:  The following meds were prescribed:   New Prescriptions   CLONAZEPAM (KLONOPIN) 1 MG TABLET    Take 1 tablet (1 mg total) by mouth 2 (two) times daily.   LISINOPRIL (PRINIVIL,ZESTRIL) 20 MG TABLET    Take 1 tablet (20 mg total) by  mouth daily.    2.  Patient Education/Counseling:  The patient was given appropriate handouts, self care instructions, and instructed in symptomatic relief. Specifically discussed salt and sodium restriction, weight control, and exercise.   3.  Follow up:  The patient was told to follow up here if no better in 3 to 4 days, or sooner if becoming worse in any way, and given some red flag symptoms such as severe headache, vision changes, shortness of breath, chest pain or stroke like symptoms which would prompt immediate return.      Reuben Likes, MD 09/28/13 [phone]

## 2013-11-25 ENCOUNTER — Encounter (HOSPITAL_COMMUNITY): Payer: Self-pay | Admitting: Emergency Medicine

## 2013-11-25 ENCOUNTER — Emergency Department (HOSPITAL_COMMUNITY)
Admission: EM | Admit: 2013-11-25 | Discharge: 2013-11-25 | Disposition: A | Discharge status: Home / Self Care | Payer: Medicaid Other | Attending: Emergency Medicine | Admitting: Emergency Medicine

## 2013-11-25 DIAGNOSIS — R519 Headache, unspecified: Secondary | ICD-10-CM

## 2013-11-25 DIAGNOSIS — F411 Generalized anxiety disorder: Secondary | ICD-10-CM | POA: Insufficient documentation

## 2013-11-25 DIAGNOSIS — G479 Sleep disorder, unspecified: Secondary | ICD-10-CM | POA: Insufficient documentation

## 2013-11-25 DIAGNOSIS — R51 Headache: Secondary | ICD-10-CM | POA: Insufficient documentation

## 2013-11-25 DIAGNOSIS — I10 Essential (primary) hypertension: Secondary | ICD-10-CM | POA: Insufficient documentation

## 2013-11-25 DIAGNOSIS — Z79899 Other long term (current) drug therapy: Secondary | ICD-10-CM | POA: Insufficient documentation

## 2013-11-25 DIAGNOSIS — Z760 Encounter for issue of repeat prescription: Secondary | ICD-10-CM

## 2013-11-25 DIAGNOSIS — Z885 Allergy status to narcotic agent status: Secondary | ICD-10-CM | POA: Insufficient documentation

## 2013-11-25 DIAGNOSIS — R450 Nervousness: Secondary | ICD-10-CM | POA: Insufficient documentation

## 2013-11-25 DIAGNOSIS — Z87891 Personal history of nicotine dependence: Secondary | ICD-10-CM | POA: Insufficient documentation

## 2013-11-25 DIAGNOSIS — F419 Anxiety disorder, unspecified: Secondary | ICD-10-CM

## 2013-11-25 MED ORDER — ALPRAZOLAM 0.25 MG PO TABS
1.0000 mg | ORAL_TABLET | Freq: Once | ORAL | Status: AC
  Administered 2013-11-25: 1 mg via ORAL
  Filled 2013-11-25: qty 4

## 2013-11-25 MED ORDER — ALPRAZOLAM 1 MG PO TABS: 1.0000 mg | ORAL_TABLET | Freq: Two times a day (BID) | ORAL | Status: DC | PRN

## 2013-11-25 MED ORDER — LISINOPRIL 20 MG PO TABS: 20.0000 mg | ORAL_TABLET | Freq: Every day | ORAL | Status: AC

## 2013-11-25 NOTE — ED Notes (Addendum)
Reports hx of htn, unable to get into pcp office and out of xanax x 1 month. Having anxiety and headaches, bp 133/73, denies n/v. Denies any SI or HI.

## 2013-11-25 NOTE — ED Provider Notes (Signed)
Medical screening examination/treatment/procedure(s) were performed by non-physician practitioner and as supervising physician I was immediately available for consultation/collaboration.   EKG Interpretation None        Ananya Mccleese, MD 11/25/13 1535 

## 2013-11-25 NOTE — Discharge Planning (Signed)
[facility] Community [facility]iaison  Per patients PA, patient has been unable to find a primary care provider that accepts her medicaid. Follow up appointment made with [facility] for Tuesday June 30,2015 at 2:00 pm. Patient is aware of this appointment, my contact information was provided. No other needs expressed at this time.

## 2013-11-25 NOTE — ED Provider Notes (Signed)
CSN: [id number]     Arrival date & time 11/25/13  1317 History  This chart was scribed for Junius FinnerErin O'Malley, PA working with Glynn OctaveStephen Rancour, MD by Quintella ReichertMatthew Underwood, ED Scribe. This patient was seen in room TR07C/TR07C and the patient's care was started at 1:55 PM.   Chief Complaint  Patient presents with  . Headache    The history is provided by the patient. No language interpreter was used.    HPI Comments: Sheena Gross is a 38 y.o. female with h/o HTN and anxiety who presents to the Emergency Department complaining of a headache and anxiety and requesting medication refills.  Pt normally medicates daily with lisinopril 20mg  1x/day for HTN and Xanax 1mg  2 or 3x/day for anxiety.  However she has been unable to get into a PCP's office and has been out of her Xanax for one month.  She reports that for the past few weeks she has been having frequent gradual-onset headaches at worst severity of 8-9/10.  Headaches are described as typical for those which she typically experiences when she is not taking her BP and anxiety medications and which she does not have when she is taking her medications regularly.  She has attempted to treat her current headache with Tylenol, ibuprofen, and Claritin, without relief.  She has also had associated nausea but no vomiting.  She has also been sleeping poorly recently but when on her medications sleeps 7-8 hours/night.  Pt's PCP recently moved and she has not been able to get a new one.  However she recently acquired Medicaid.  She came to the ED today requesting a small amount of Xanax and help in acquiring a PCP.  Pt notes that she had a seizure in 08/25/12 while at school which she attributes to being very anxious at that time, and she notes she feels similarly currently.  She hit her head on a desk and dislocated her jaw during the seizure.  She has never been seen by a neurologist.     Past Medical History  Diagnosis Date  . Anxiety   . Hypertension      History reviewed. No pertinent past surgical history.  History reviewed. No pertinent family history.   History  Substance Use Topics  . Smoking status: Former Smoker    Quit date: 10/29/2012  . Smokeless tobacco: Not on file  . Alcohol Use: No    OB History   Grav Para Term Preterm Abortions TAB SAB Ect Mult Living                   Review of Systems  Neurological: Positive for headaches.  Psychiatric/Behavioral: Positive for sleep disturbance. The patient is nervous/anxious.   All other systems reviewed and are negative.     Allergies  Codeine  Home Medications   Prior to Admission medications   Medication Sig Start Date End Date Taking? Authorizing Provider  ALPRAZolam Prudy Feeler(XANAX) 1 MG tablet Take 1 mg by mouth 3 (three) times daily as needed. For anxiety    Historical Provider, MD  ALPRAZolam Prudy Feeler(XANAX) 1 MG tablet Take 1 tablet (1 mg total) by mouth daily as needed for anxiety. 08/25/12   Stevie Kernyan McLennan, MD  clonazePAM (KLONOPIN) 1 MG tablet Take 1 tablet (1 mg total) by mouth 2 (two) times daily. 09/28/13   Reuben Likesavid C Keller, MD  lisinopril (PRINIVIL,ZESTRIL) 20 MG tablet Take 20 mg by mouth daily.    Historical Provider, MD  lisinopril (PRINIVIL,ZESTRIL) 20 MG tablet Take 1  tablet (20 mg total) by mouth daily. 09/28/13   Reuben Likesavid C Keller, MD  oxyCODONE-acetaminophen (PERCOCET/ROXICET) 5-325 MG per tablet Take 1 tablet by mouth every 4 (four) hours as needed for pain. 08/25/12   Stevie Kernyan McLennan, MD   BP 133/73  Pulse 104  Temp(Src) 98 F (36.7 C) (Oral)  Resp 16  Ht 5\' 11"  (1.803 m)  Wt 159 lb 11.2 oz (72.439 kg)  BMI 22.28 kg/m2  SpO2 98%  LMP 11/11/2013  Physical Exam  Nursing note and vitals reviewed. Constitutional: She is oriented to person, place, and time. She appears well-developed and well-nourished. No distress.  HENT:  Head: Normocephalic and atraumatic.  Eyes: Conjunctivae and EOM are normal. Pupils are equal, round, and reactive to light. No scleral  icterus.  Neck: Normal range of motion. Neck supple.  No nuchal rigidity or meningeal signs.  Cardiovascular: Normal rate, regular rhythm and normal heart sounds.   Pulmonary/Chest: Effort normal and breath sounds normal. No respiratory distress. She has no wheezes. She has no rales. She exhibits no tenderness.  Abdominal: Soft. Bowel sounds are normal. She exhibits no distension and no mass. There is no tenderness. There is no rebound and no guarding.  Musculoskeletal: Normal range of motion.  Neurological: She is alert and oriented to person, place, and time. No cranial nerve deficit. Gait normal. GCS eye subscore is 4. GCS verbal subscore is 5. GCS motor subscore is 6.  Skin: Skin is warm and dry. She is not diaphoretic.  Psychiatric: She has a normal mood and affect. Her behavior is normal.    ED Course  Procedures (including critical care time)  DIAGNOSTIC STUDIES: Oxygen Saturation is 98% on room air, normal by my interpretation.    COORDINATION OF CARE: 2:02 PM-Discussed treatment plan which pt at bedside which includes a small amount of Xanax and speaking with case management to help pt find PCP.  Pt agrees to plan.    Labs Review Labs Reviewed - No data to display  Imaging Review No results found.   EKG Interpretation None      MDM   Final diagnoses:  Headache  Anxiety  Medication refill    Patient presented with hx of headaches and anxiety. Patient requesting refill of medications.  Reports headache feels similar to previous. Not concerned for emergent process taking place at this time. Normal neuro exam.  Do not believe further workup needed a this time. Medications, lisinopril and xanax (10 tabs) refilled. Consulted with Case Management to help pt establish care with PCP. Resource guide provided.  Also provided [facility] due to reports of frequent headaches and previous seizure w/o neurology f/u.  Return precautions provided. Pt verbalized  understanding and agreement with tx plan.    I personally performed the services described in this documentation, which was scribed in my presence. The recorded information has been reviewed and is accurate.    Junius Finnerrin O'Malley, PA-C 11/25/13 1426

## 2013-11-25 NOTE — ED Notes (Signed)
Pt ambulates without distress. Pt alert x4 

## 2013-12-29 ENCOUNTER — Emergency Department (HOSPITAL_COMMUNITY): Payer: Medicaid Other

## 2013-12-29 ENCOUNTER — Emergency Department (HOSPITAL_COMMUNITY)
Admission: EM | Admit: 2013-12-29 | Discharge: 2013-12-29 | Disposition: A | Discharge status: Home / Self Care | Payer: Medicaid Other | Attending: Emergency Medicine | Admitting: Emergency Medicine

## 2013-12-29 ENCOUNTER — Encounter (HOSPITAL_COMMUNITY): Payer: Self-pay | Admitting: Emergency Medicine

## 2013-12-29 DIAGNOSIS — S0300XA Dislocation of jaw, unspecified side, initial encounter: Secondary | ICD-10-CM | POA: Insufficient documentation

## 2013-12-29 DIAGNOSIS — Y929 Unspecified place or not applicable: Secondary | ICD-10-CM | POA: Insufficient documentation

## 2013-12-29 DIAGNOSIS — F17200 Nicotine dependence, unspecified, uncomplicated: Secondary | ICD-10-CM | POA: Insufficient documentation

## 2013-12-29 DIAGNOSIS — F411 Generalized anxiety disorder: Secondary | ICD-10-CM | POA: Insufficient documentation

## 2013-12-29 DIAGNOSIS — R296 Repeated falls: Secondary | ICD-10-CM | POA: Insufficient documentation

## 2013-12-29 DIAGNOSIS — R569 Unspecified convulsions: Secondary | ICD-10-CM | POA: Insufficient documentation

## 2013-12-29 DIAGNOSIS — Y939 Activity, unspecified: Secondary | ICD-10-CM | POA: Insufficient documentation

## 2013-12-29 DIAGNOSIS — Z79899 Other long term (current) drug therapy: Secondary | ICD-10-CM | POA: Insufficient documentation

## 2013-12-29 DIAGNOSIS — I10 Essential (primary) hypertension: Secondary | ICD-10-CM | POA: Insufficient documentation

## 2013-12-29 DIAGNOSIS — F19239 Other psychoactive substance dependence with withdrawal, unspecified: Secondary | ICD-10-CM

## 2013-12-29 MED ORDER — HYDROMORPHONE HCL PF 1 MG/ML IJ SOLN
0.5000 mg | Freq: Once | INTRAMUSCULAR | Status: AC
  Administered 2013-12-29: 0.5 mg via INTRAVENOUS
  Filled 2013-12-29: qty 1

## 2013-12-29 MED ORDER — PROPOFOL 10 MG/ML IV EMUL
INTRAVENOUS | Status: AC | PRN
  Administered 2013-12-29: 10 mL via INTRAVENOUS

## 2013-12-29 MED ORDER — KETOROLAC TROMETHAMINE 30 MG/ML IJ SOLN
30.0000 mg | Freq: Once | INTRAMUSCULAR | Status: AC
  Administered 2013-12-29: 30 mg via INTRAVENOUS
  Filled 2013-12-29: qty 1

## 2013-12-29 MED ORDER — HYDROMORPHONE HCL PF 1 MG/ML IJ SOLN
1.0000 mg | Freq: Once | INTRAMUSCULAR | Status: AC
  Administered 2013-12-29: 1 mg via INTRAVENOUS
  Filled 2013-12-29: qty 1

## 2013-12-29 MED ORDER — IBUPROFEN 800 MG PO TABS: 800.0000 mg | ORAL_TABLET | Freq: Three times a day (TID) | ORAL | Status: AC

## 2013-12-29 MED ORDER — PROPOFOL 10 MG/ML IV BOLUS
0.5000 mg/kg | Freq: Once | INTRAVENOUS | Status: AC
  Filled 2013-12-29: qty 20

## 2013-12-29 MED ORDER — ALPRAZOLAM 1 MG PO TABS: 1.0000 mg | ORAL_TABLET | Freq: Every evening | ORAL | Status: AC | PRN

## 2013-12-29 MED ORDER — CYCLOBENZAPRINE HCL 10 MG PO TABS: 10.0000 mg | ORAL_TABLET | Freq: Two times a day (BID) | ORAL | Status: AC | PRN

## 2013-12-29 MED ORDER — DIAZEPAM 5 MG/ML IJ SOLN
5.0000 mg | Freq: Once | INTRAMUSCULAR | Status: AC
  Administered 2013-12-29: 5 mg via INTRAVENOUS
  Filled 2013-12-29: qty 2

## 2013-12-29 MED ORDER — ALPRAZOLAM 0.25 MG PO TABS
1.0000 mg | ORAL_TABLET | Freq: Once | ORAL | Status: AC
  Administered 2013-12-29: 1 mg via ORAL
  Filled 2013-12-29: qty 4

## 2013-12-29 NOTE — ED Notes (Signed)
Gwendolyn Grant, MD and Thayer Ohm, RN in with pt performing a procedure

## 2013-12-29 NOTE — Discharge Instructions (Signed)
Benzodiazepine Withdrawal  °Benzodiazepines are a group of drugs that are prescribed for both short-term and long-term treatment of a variety of medical conditions. For some of these conditions, such as seizures and sudden and severe muscle spasms, they are used only for a few hours or a few days. For other conditions, such as anxiety, sleep problems, or frequent muscle spasms or to help prevent seizures, they are used for an extended period, usually weeks or months. °Benzodiazepines work by changing the way your brain functions. Normally, chemicals in your brain called neurotransmitters send messages between your brain cells. The neurotransmitter that benzodiazepines affect is called gamma-aminobutyric acid (GABA). GABA sends out messages that have a calming effect on many of the functions of your brain. Benzodiazepines make these messages stronger and increase this calming effect. °Short-term use of benzodiazepines usually does not cause problems when you stop taking the drugs. However, if you take benzodiazepines for a long time, your body can adjust to the drug and require more of it to produce the same effect (drug tolerance). Eventually, you can develop physical dependence on benzodiazepines, which is when you experience negative effects if your dosage of benzodiazepines is reduced or stopped too quickly. These negative effects are called symptoms of withdrawal. °SYMPTOMS °Symptoms of withdrawal may begin anytime within the first 10 days after you stop taking the benzodiazepine. They can last from several weeks up to a few months but usually are the worst between the first 10 to 14 days.  °The actual symptoms also vary, depending on the type of benzodiazepine you take. Possible symptoms include: °· Anxiety. °· Excitability. °· Irritability. °· Depression. °· Mood swings. °· Trouble sleeping. °· Confusion. °· Uncontrollable shaking (tremors). °· Muscle weakness. °· Seizures. °DIAGNOSIS °To diagnose  benzodiazepine withdrawal, your caregiver will examine you for certain signs, such as: °· Rapid heartbeat. °· Rapid breathing. °· Tremors. °· High blood pressure. °· Fever. °· Mood changes. °Your caregiver also may ask the following questions about your use of benzodiazepines: °· What type of benzodiazepine did you take? °· How much did you take each day? °· How long did you take the drug? °· When was the last time you took the drug? °· Do you take any other drugs? °· Have you had alcohol recently? °· Have you had a seizure recently? °· Have you lost consciousness recently? °· Have you had trouble remembering recent events? °· Have you had a recent increase in anxiety, irritability, or trouble sleeping? °A drug test also may be administered. °TREATMENT °The treatment for benzodiazepine withdrawal can vary, depending on the type and severity of your symptoms, what type of benzodiazepine you have been taking, and how long you have been taking the benzodiazepine. Sometimes it is necessary for you to be treated in a hospital, especially if you are at risk of seizures.  °Often, treatment includes a prescription for a long-acting benzodiazepine, the dosage of which is reduced slowly over a long period. This period could be several weeks or months. Eventually, your dosage will be reduced to a point that you can stop taking the drug, without experiencing withdrawal symptoms. This is called tapered withdrawal. Occasionally, minor symptoms of withdrawal continue for a few days or weeks after you have completed a tapered withdrawal. °SEEK IMMEDIATE MEDICAL CARE IF: °· You have a seizure. °· You develop a craving for drugs or alcohol. °· You begin to experience symptoms of withdrawal during your tapered withdrawal. °· You become very confused. °· You lose consciousness. °· You   have trouble breathing.  You think about hurting yourself or someone else. Document Released: 07/04/2011 Document Revised: 10/07/2011 Document  Reviewed: 07/04/2011 [facility] Patient Information 2014 [place], [place]. Jaw Dislocation A jaw dislocation is the displacement of the joint where the upper jaw bone (maxilla) and the lower jaw bone (mandibula) meet (temporomandibular joint). Soon after the dislocation, the jaw muscles tighten. This prevents the mouth from closing normally.  CAUSES A jaw dislocation usually is caused by a sudden forceful impact to the jaw. A strong punch in the jaw during a fist fight or a sports injury are examples of causes of jaw dislocation. Another cause is injury due to car or motorcycle accidents. RISK FACTORS Although anyone can have a jaw dislocation, some people are more at risk than others. People at increased risk for jaw dislocation include participants in contact sports. SYMPTOMS Symptoms of jaw dislocation can vary, depending on the severity of the dislocation. They can include:  Feeling that your teeth are out of alignment when you bite.  Inability to close your mouth completely.  Drooling.  Extreme pain, with the inability to move your jaw. DIAGNOSIS Your caregiver will feel your temporomandibular joints and ask you to move your jaw. Your caregiver also will feel the inside of your mouth to make sure there are no fractures or cuts (lacerations). TREATMENT Your caregiver will manipulate your jaw to put it back into place (reduction). If you have any jaw fractures from the dislocation, they usually will be held in place with plates and screws or with wiring.  HOME CARE INSTRUCTIONS The following measures can help to reduce pain and hasten the healing process:  Rest your injured joint. Do not move it. Avoid activities similar to the one that caused your injury.  Apply ice to your injured joint for 1 to 2 days after your reduction or as directed by your caregiver. Applying ice helps to reduce inflammation and pain.  Put ice in a plastic bag.  Place a towel between your skin and the  bag.  Leave the ice on for 15-20 minutes at a time, 03-04 times a day.  Take over-the-counter or prescription medication for pain as directed by your caregiver. Also, your caregiver may instruct you to only have certain foods until your jaw heals. These foods may be soft or liquified so that your jaw does not have to move much to eat them. SEEK IMMEDIATE MEDICAL CARE IF:  You have plates and screws or wiring to hold your jaw together that becomes loose or damaged.  You develop drainage from any of the cuts (incisions) where your wires or plates and screws were placed.  Your pain becomes worse rather than better. MAKE SURE YOU:  Understand these instructions.  Will watch your condition.  Will get help right away if you are not doing well or get worse. Document Released: 07/12/2000 Document Revised: 10/07/2011 Document Reviewed: 12/13/2010 [facility] Patient Information 2014 [place], [place].

## 2013-12-29 NOTE — ED Notes (Signed)
Report from [place], [place]; pt is not in the room at this time

## 2013-12-29 NOTE — ED Provider Notes (Addendum)
CSN: [id number]     Arrival date & time 12/29/13  2774 History   First MD Initiated Contact with Patient 12/29/13 [phone]     Chief Complaint  Patient presents with  . Fall  . Jaw Pain     (Consider location/radiation/quality/duration/timing/severity/associated sxs/prior Treatment) Patient is a 38 y.o. female presenting with fall and seizures. The history is provided by the patient, a parent and the spouse. No language interpreter was used.  Fall This is a new problem. The current episode started today. Pertinent negatives include no fever. Associated symptoms comments: She fell with questionable seizure activity today, hitting her jaw on a table causing likely dislocation. She has had similar symptoms in the past with injury. She complains of pain in both sides of the jaw and being unable to close her mouth. .  Seizures Seizure activity on arrival: no   Seizure type:  Grand mal Return to baseline: yes   Context comment:  Patient had one seizure one year ago and then again today.  History of seizures: yes   Current therapy:  None   Past Medical History  Diagnosis Date  . Anxiety   . Hypertension    History reviewed. No pertinent past surgical history. History reviewed. No pertinent family history. History  Substance Use Topics  . Smoking status: Current Some Day Smoker -- 0.50 packs/day    Types: Cigarettes    Last Attempt to Quit: 10/29/2012  . Smokeless tobacco: Not on file  . Alcohol Use: No   OB History   Grav Para Term Preterm Abortions TAB SAB Ect Mult Living                 Review of Systems  Constitutional: Negative for fever.  HENT:       See HPI.  Neurological: Positive for seizures.      Allergies  Codeine  Home Medications   Prior to Admission medications   Medication Sig Start Date End Date Taking? Authorizing Provider  ALPRAZolam Prudy Feeler) 1 MG tablet Take 1 mg by mouth 3 (three) times daily.   Yes Historical Provider, MD  lisinopril  (PRINIVIL,ZESTRIL) 20 MG tablet Take 1 tablet (20 mg total) by mouth daily. 11/25/13  Yes Junius Finner, PA-C  Multiple Vitamin (MULTIVITAMIN WITH MINERALS) TABS tablet Take 1 tablet by mouth daily.   Yes Historical Provider, MD   BP 108/63  Pulse 56  Temp(Src) 98.6 F (37 C) (Oral)  Resp 16  Ht 5\' 11"  (1.803 m)  Wt 160 lb (72.576 kg)  BMI 22.33 kg/m2  SpO2 100%  LMP 12/22/2013 Physical Exam  Constitutional: She is oriented to person, place, and time. She appears well-developed and well-nourished.  HENT:  Head: Normocephalic.  Significant malocclusion with jaw symmetrically set forward c/w mandibular dislocation. No dental injury apparent.   Neck: Normal range of motion. Neck supple.  Cardiovascular: Normal rate and regular rhythm.   Pulmonary/Chest: Effort normal and breath sounds normal. She exhibits no tenderness.  Abdominal: Soft. Bowel sounds are normal. There is no tenderness. There is no rebound and no guarding.  Musculoskeletal: Normal range of motion.  No midline cervical tenderness.   Neurological: She is alert and oriented to person, place, and time.  Skin: Skin is warm and dry. No rash noted.  Psychiatric:  Patient is anxious, restless.     ED Course  Procedures (including critical care time) Labs Review Labs Reviewed - No data to display  Imaging Review Ct Maxillofacial Wo Cm  12/29/2013  CLINICAL DATA:  Pain post trauma; seizure  EXAM: CT MAXILLOFACIAL WITHOUT CONTRAST  TECHNIQUE: Multidetector CT imaging of the maxillofacial structures was performed. Multiplanar CT image reconstructions were also generated. A small metallic BB was placed on the right temple in order to reliably differentiate right from left.  COMPARISON:  None.  FINDINGS: There is apparent dislocation of both mandibular condyles anteriorly with respect to the temporomandibular fossae bilaterally. No mandibular fractures are appreciated on this study. Elsewhere, there is no demonstrable fracture or  dislocation. There is soft tissue swelling over the right face. There is preseptal swelling over the right eye. There is no intraorbital lesion on either side.  There is mucosal thickening in multiple ethmoid air cells bilaterally. There is extensive nasal turbinate edema bilaterally with nares obstruction bilaterally. There is mucosal thickening along the superior aspects of each maxillary antrum. The ostiomeatal unit complex on the left is narrowed but patent. There is obstruction of the ostiomeatal unit complex on the right. There is no bony destruction or expansion appreciable.  There is minimal rightward deviation of the nasal septum.  The visualized brain parenchyma is within normal limits. Mastoid air cells are clear. Both styloid processes are unusually long; this finding is more dramatic on the right than on the left. There is erosive change in the superior left alveolar ridge at the site of the the superior left first molar consistent with dental disease.  IMPRESSION: There is anterior dislocation of both mandibular condyles with respect to the temporomandibular fossae. No fractures in this area seen.  Elsewhere, there is no fracture. There is soft tissue swelling over the right face and preseptal right eye region.  Paranasal sinus disease as described. Nares obstruction bilaterally due to edema. Obstruction of the right ostiomeatal unit complex. Narrowing of the left ostiomeatal unit complex.  Both styloid processes are unusually long. The significance of this finding clinically is uncertain.  Erosion of the superior left alveolar ridge at the level of the first superior molar on the left. Early abscess in this area cannot be excluded.  These results were called by telephone at the time of interpretation on 12/29/2013 at 11:41 AM to [facility] Drisana Schweickert, PA, who verbally acknowledged these results.   Electronically Signed   By: Bretta BangWilliam  Woodruff M.D.   On: 12/29/2013 11:41     EKG Interpretation None       MDM   Final diagnoses:  None    1. Withdrawal seizure 2. Benzodiazepine dependence 3. Mandible dislocation  The mandible was easily reduced under conscious sedation, per Dr. Tyson AliasWalden's note. Discussed the patient's use of Xanax chronically and then allowing the prescription to run out with danger of recurrent seizure. She reports she feels she is ready to wean off medication and is encouraged to follow up with PCP for this to be done. Fel[facility] has again arranged for outpatient primary care follow up.     Arnoldo HookerShari A Torii Royse, PA-C 12/30/13 2131  Arnoldo HookerShari A Izic Stfort, PA-C 03/16/14 1825

## 2013-12-29 NOTE — ED Notes (Signed)
Pt from home, c/o fall, siezure vs syncope. Pt c/o jaw pain, has hx of jaw dislocation. Pt states she is very anxious

## 2013-12-29 NOTE — Discharge Planning (Signed)
[facility]iaison  Spoke to patient about care plan in place from last ED visit. Pt states she missed the appointment that was made for her. I informed patient that the appointment had not passed. Follow up with Family Medicine at [facility] for Tuesday June 30,2015 at 2:00 pm. Patient and family at bedside made aware of the location and time of the appointment. Patient was educated on the importance of following through with her appointment to establish primary care for management of current health issues. My contact information provided for any future questions or concerns. No other needs expressed at this time.

## 2013-12-29 NOTE — ED Notes (Signed)
Pt's IV removed, pt getting dressed to be discharged home; family in room with pt

## 2013-12-29 NOTE — ED Provider Notes (Signed)
Medical screening examination/treatment/procedure(s) were conducted as a shared visit with non-physician practitioner(s) and myself.  I personally evaluated the patient during the encounter.   EKG Interpretation None       Here with bilateral TMJ dislocation. Unable to get in after pain medicines - extremely anxious. Successfully reduced with propofol sedation.  Procedural sedation Performed by: Dagmar Hait Consent: Verbal consent obtained. Risks and benefits: risks, benefits and alternatives were discussed Required items: required blood products, implants, devices, and special equipment available Patient identity confirmed: arm band and provided demographic data Time out: Immediately prior to procedure a "time out" was called to verify the correct patient, procedure, equipment, support staff and site/side marked as required.  Sedation type: moderate (conscious) sedation NPO time confirmed and considedered  Sedatives: PROPOFOL  Physician Time at Bedside: 15 minutes  Vitals: Vital signs were monitored during sedation. Cardiac Monitor, pulse oximeter Patient tolerance: Patient tolerated the procedure well with no immediate complications. Comments: Pt with uneventful recovered. Returned to pre-procedural sedation baseline     Dagmar Hait, MD 12/29/13 1558

## 2013-12-29 NOTE — ED Notes (Signed)
Patient transported to CT 

## 2014-01-03 NOTE — ED Provider Notes (Signed)
Medical screening examination/treatment/procedure(s) were conducted as a shared visit with non-physician practitioner(s) and myself.  I personally evaluated the patient during the encounter.   EKG Interpretation   Date/Time:  Wednesday December 29 2013 09:02:54 EDT Ventricular Rate:  80 PR Interval:    QRS Duration: 100 QT Interval:  414 QTC Calculation: 478 R Axis:   86 Text Interpretation:  Age not entered, assumed to be  38 years old for  purpose of ECG interpretation Atrial fibrillation Borderline  repolarization abnormality Borderline prolonged QT interval ED PHYSICIAN  INTERPRETATION AVAILABLE IN [facility] Confirmed by TEST, Record  ([id number]) on 12/31/2013 7:17:44 AM      Please see my note for conscious sedation. Hx of jaw dislocation, happened again today. Reduced easily with conscious sedation.   Dagmar Hait, MD 01/03/14 2218

## 2014-03-18 NOTE — ED Provider Notes (Signed)
Medical screening examination/treatment/procedure(s) were conducted as a shared visit with non-physician practitioner(s) and myself.  I personally evaluated the patient during the encounter.   EKG Interpretation   Date/Time:  Wednesday December 29 2013 09:02:54 EDT Ventricular Rate:  80 PR Interval:    QRS Duration: 100 QT Interval:  414 QTC Calculation: 478 R Axis:   86 Text Interpretation:  Age not entered, assumed to be  38 years old for  purpose of ECG interpretation Atrial fibrillation Borderline  repolarization abnormality Borderline prolonged QT interval ED PHYSICIAN  INTERPRETATION AVAILABLE IN [facility] Confirmed by TEST, Record  ([id number]) on 12/31/2013 7:17:44 AM      Patient here with withdrawal seizure - chronic xanax user. I was at bedside for sedation and reduction of jaw dislocation by Elpidio AnisShari Upstill. Awake from sedation, doing well, stable for discharge.    Elwin MochaBlair Reade Trefz, MD 03/18/14 [phone]

## 2019-10-02 ENCOUNTER — Emergency Department (HOSPITAL_COMMUNITY)
Admission: EM | Admit: 2019-10-02 | Discharge: 2019-10-02 | Disposition: A | Discharge status: Home / Self Care | Payer: Self-pay | Attending: Emergency Medicine | Admitting: Emergency Medicine

## 2019-10-02 ENCOUNTER — Other Ambulatory Visit: Payer: Self-pay

## 2019-10-02 ENCOUNTER — Encounter (HOSPITAL_COMMUNITY): Payer: Self-pay

## 2019-10-02 DIAGNOSIS — Y9389 Activity, other specified: Secondary | ICD-10-CM | POA: Insufficient documentation

## 2019-10-02 DIAGNOSIS — I10 Essential (primary) hypertension: Secondary | ICD-10-CM | POA: Insufficient documentation

## 2019-10-02 DIAGNOSIS — F17210 Nicotine dependence, cigarettes, uncomplicated: Secondary | ICD-10-CM | POA: Insufficient documentation

## 2019-10-02 DIAGNOSIS — Z79899 Other long term (current) drug therapy: Secondary | ICD-10-CM | POA: Insufficient documentation

## 2019-10-02 DIAGNOSIS — Y998 Other external cause status: Secondary | ICD-10-CM | POA: Insufficient documentation

## 2019-10-02 DIAGNOSIS — X501XXA Overexertion from prolonged static or awkward postures, initial encounter: Secondary | ICD-10-CM | POA: Insufficient documentation

## 2019-10-02 DIAGNOSIS — Y929 Unspecified place or not applicable: Secondary | ICD-10-CM | POA: Insufficient documentation

## 2019-10-02 DIAGNOSIS — S0300XA Dislocation of jaw, unspecified side, initial encounter: Secondary | ICD-10-CM | POA: Insufficient documentation

## 2019-10-02 LAB — I-STAT BETA HCG BLOOD, ED (MC, WL, AP ONLY): I-stat hCG, quantitative: <5 m[IU]/mL (ref <5)

## 2019-10-02 MED ORDER — DIAZEPAM 2 MG PO TABS: 2.0000 mg | ORAL_TABLET | Freq: Every evening | ORAL | 0 refills | Status: DC | PRN

## 2019-10-02 MED ORDER — PROPOFOL 10 MG/ML IV BOLUS
1.0000 mg/kg | Freq: Once | INTRAVENOUS | Status: DC
  Filled 2019-10-02: qty 20

## 2019-10-02 MED ORDER — DIAZEPAM 2 MG PO TABS: 2.0000 mg | ORAL_TABLET | Freq: Every evening | ORAL | 0 refills | Status: AC | PRN

## 2019-10-02 MED ORDER — FENTANYL CITRATE (PF) 100 MCG/2ML IJ SOLN
100.0000 ug | Freq: Once | INTRAMUSCULAR | Status: AC
  Administered 2019-10-02: 100 ug via INTRAVENOUS
  Filled 2019-10-02: qty 2

## 2019-10-02 MED ORDER — DIAZEPAM 5 MG/ML IJ SOLN
2.5000 mg | Freq: Once | INTRAMUSCULAR | Status: AC
  Administered 2019-10-02: 2.5 mg via INTRAVENOUS
  Filled 2019-10-02: qty 2

## 2019-10-02 MED ORDER — ACETAMINOPHEN 325 MG PO TABS: 650.0000 mg | ORAL_TABLET | Freq: Once | ORAL | Status: DC

## 2019-10-02 MED ORDER — PROPOFOL 10 MG/ML IV BOLUS
INTRAVENOUS | Status: AC | PRN
  Administered 2019-10-02 (×2): 25 mg via INTRAVENOUS
  Administered 2019-10-02: 50 mg via INTRAVENOUS

## 2019-10-02 NOTE — ED Provider Notes (Signed)
.Sedation  Date/Time: 10/02/2019 1:53 PM Performed by: Virgina Norfolk, DO Authorized by: Virgina Norfolk, DO   Consent:    Consent obtained:  Verbal   Consent given by:  Patient   Risks discussed:  Allergic reaction, dysrhythmia, inadequate sedation, nausea, prolonged hypoxia resulting in organ damage, prolonged sedation necessitating reversal, respiratory compromise necessitating ventilatory assistance and intubation and vomiting   Alternatives discussed:  Analgesia without sedation, anxiolysis and regional anesthesia Universal protocol:    Procedure explained and questions answered to patient or proxy's satisfaction: yes     Relevant documents present and verified: yes     Test results available and properly labeled: yes     Imaging studies available: yes     Required blood products, implants, devices, and special equipment available: yes     Site/side marked: yes     Immediately prior to procedure a time out was called: yes     Patient identity confirmation method:  Verbally with patient Indications:    Procedure necessitating sedation performed by:  Physician performing sedation Pre-sedation assessment:    Time since last food or drink:  8 hours   ASA classification: class 1 - normal, healthy patient     Neck mobility: normal     Mouth opening:  3 or more finger widths   Thyromental distance:  4 finger widths   Mallampati score:  I - soft palate, uvula, fauces, pillars visible   Pre-sedation assessments completed and reviewed: airway patency, cardiovascular function, hydration status, mental status, nausea/vomiting, pain level, respiratory function and temperature     Pre-sedation assessment completed:  10/02/2019 1:53 PM Immediate pre-procedure details:    Reassessment: Patient reassessed immediately prior to procedure     Reviewed: vital signs, relevant labs/tests and NPO status     Verified: bag valve mask available, emergency equipment available, intubation equipment available,  IV patency confirmed, oxygen available and suction available   Procedure details (see MAR for exact dosages):    Preoxygenation:  Nasal cannula   Sedation:  Propofol   Intended level of sedation: moderate (conscious sedation)   Intra-procedure monitoring:  Blood pressure monitoring, cardiac monitor, continuous pulse oximetry, frequent LOC assessments, frequent vital sign checks and continuous capnometry   Intra-procedure events: none     Total Provider sedation time (minutes):  18 Post-procedure details:    Post-sedation assessment completed:  10/02/2019 2:46 PM   Attendance: Constant attendance by certified staff until patient recovered     Recovery: Patient returned to pre-procedure baseline     Post-sedation assessments completed and reviewed: airway patency, cardiovascular function, hydration status, mental status, nausea/vomiting, pain level, respiratory function and temperature     Patient is stable for discharge or admission: yes     Patient tolerance:  Tolerated well, no immediate complications    Medical screening examination/treatment/procedure(s) were conducted as a shared visit with non-physician practitioner(s) and myself.  I personally evaluated the patient during the encounter. Briefly, the patient is a 44 y.o. female who presents to the ED with jaw dislocation.  History of the same.  Normal vitals.  No fever.  Patient yawned and felt a pop and has been unable to close her mouth.  She has exam consistent with anterior dislocation of the jaw.  She was given fentanyl and Valium but ultimately needed propofol sedation for reduction of jaw dislocation.  Under propofol sedation we were able to successfully reduce jaw dislocation.  Patient was able to open and close her mouth without any issues.  She was  given education about maintaining liquid diet and avoiding foods and activities that could cause this to happen again.  We will have her follow-up with ENT.  Discharged in good  condition.  This chart was dictated using voice recognition software.  Despite best efforts to proofread,  errors can occur which can change the documentation meaning.     EKG Interpretation None           Lennice Sites, DO 10/02/19 1447

## 2019-10-02 NOTE — ED Notes (Addendum)
Patient screaming out in pain.   Cortni, PA called to room.

## 2019-10-02 NOTE — ED Notes (Addendum)
Consent form signed, CO2 monitor set up, Ambu bag set up,IV patent, suction set up, crash cart and respiratory at bedside.

## 2019-10-02 NOTE — ED Notes (Signed)
Patient ambulated to restroom with no assistance.  

## 2019-10-02 NOTE — ED Provider Notes (Signed)
[facility]-EMERGENCY DEPT Provider Note   CSN: [id number] Arrival date & time: 10/02/19  1309     History Chief Complaint  Patient presents with  . Mouth Injury    Sheena Gross is a 44 y.o. female.  HPI   44 year old female with history of anxiety, hypertension, presenting for evaluation of jaw dislocation.  States she was yawning prior to arrival and her jaw popped out of place.  She has pain to the bilateral jaw.  She states this is happened to her several times in the past but typically it has been associated with seizures in the past.  She denies any other injuries or concerns.  Past Medical History:  Diagnosis Date  . Anxiety   . Hypertension     Patient Active Problem List   Diagnosis Date Noted  . Incarcerated umbilical hernia 04/30/2013    History reviewed. No pertinent surgical history.   OB History   No obstetric history on file.     History reviewed. No pertinent family history.  Social History   Tobacco Use  . Smoking status: Current Some Day Smoker    Packs/day: 0.50    Types: Cigarettes    Last attempt to quit: 10/29/2012    Years since quitting: 6.9  Substance Use Topics  . Alcohol use: No  . Drug use: No    Home Medications Prior to Admission medications   Medication Sig Start Date End Date Taking? Authorizing Provider  ALPRAZolam Prudy Feeler) 1 MG tablet Take 1 mg by mouth 3 (three) times daily.    [provider]  ALPRAZolam Prudy Feeler) 1 MG tablet Take 1 tablet (1 mg total) by mouth at bedtime as needed for anxiety. 12/29/13   Elpidio Anis, PA-C  cyclobenzaprine (FLEXERIL) 10 MG tablet Take 1 tablet (10 mg total) by mouth 2 (two) times daily as needed for muscle spasms. 12/29/13   Elpidio Anis, PA-C  diazepam (VALIUM) 2 MG tablet Take 1 tablet (2 mg total) by mouth at bedtime as needed for up to 5 doses for anxiety. 10/02/19   Saylah Ketner S, PA-C  ibuprofen (ADVIL,MOTRIN) 800 MG tablet Take 1 tablet (800 mg total)  by mouth 3 (three) times daily. 12/29/13   Elpidio Anis, PA-C  lisinopril (PRINIVIL,ZESTRIL) 20 MG tablet Take 1 tablet (20 mg total) by mouth daily. 11/25/13   Lurene Shadow, PA-C  Multiple Vitamin (MULTIVITAMIN WITH MINERALS) TABS tablet Take 1 tablet by mouth daily.    [provider]    Allergies    Codeine  Review of Systems   Review of Systems  Constitutional: Negative for fever.  HENT: Negative for ear pain.        Jaw pain  Eyes: Negative for visual disturbance.  Respiratory: Negative for cough and shortness of breath.   Cardiovascular: Negative for chest pain.  Gastrointestinal: Negative for abdominal pain and vomiting.  Genitourinary: Negative for dysuria and hematuria.  Musculoskeletal: Negative for back pain.  Skin: Negative for rash.  Neurological: Negative for headaches.  All other systems reviewed and are negative.   Physical Exam Updated Vital Signs BP 134/78   Pulse 60   Resp 11   Ht 5\' 10"  (1.778 m)   Wt 74.8 kg   SpO2 100%   BMI 23.68 kg/m   Physical Exam Vitals and nursing note reviewed.  Constitutional:      General: She is not in acute distress.    Appearance: She is well-developed.  HENT:  Head: Normocephalic and atraumatic.     Mouth/Throat:     Comments: Mouth is open and has TTP with muscle spasms to the bilat TMJs and along the masseters. Pt unable to close mouth.  Eyes:     Conjunctiva/sclera: Conjunctivae normal.  Cardiovascular:     Rate and Rhythm: Normal rate.  Pulmonary:     Effort: Pulmonary effort is normal.  Musculoskeletal:        General: Normal range of motion.     Cervical back: Neck supple.  Skin:    General: Skin is warm and dry.  Neurological:     Mental Status: She is alert.  Psychiatric:     Comments: Anxious, tearful     ED Results / Procedures / Treatments   Labs (all labs ordered are listed, but only abnormal results are displayed) Labs Reviewed  I-STAT BETA HCG BLOOD, ED ([facility], [facility], [facility] ONLY)     EKG None  Radiology No results found.  Procedures Reduction of dislocation  Date/Time: 10/02/2019 3:12 PM Performed by: Rodney Booze, PA-C Authorized by: Rodney Booze, PA-C  Consent: Verbal consent obtained. Consent given by: patient Patient understanding: patient states understanding of the procedure being performed Patient consent: the patient's understanding of the procedure matches consent given Procedure consent: procedure consent matches procedure scheduled Relevant documents: relevant documents present and verified Test results: test results available and properly labeled Site marked: the operative site was marked Imaging studies: imaging studies available Patient identity confirmed: verbally with patient Time out: Immediately prior to procedure a "time out" was called to verify the correct patient, procedure, equipment, support staff and site/side marked as required. Preparation: Patient was prepped and draped in the usual sterile fashion. Local anesthesia used: no  Anesthesia: Local anesthesia used: no  Sedation: Patient sedated: yes Sedation type: moderate (conscious) sedation(See Dr. Sanjuana Kava note for sedation procedure) Vitals: Vital signs were monitored during sedation.  Patient tolerance: patient tolerated the procedure well with no immediate complications    (including critical care time)    Medications Ordered in ED Medications  propofol (DIPRIVAN) 10 mg/mL bolus/IV push 74.8 mg (74.8 mg Intravenous Not Given 10/02/19 1427)  acetaminophen (TYLENOL) tablet 650 mg (has no administration in time range)  diazepam (VALIUM) injection 2.5 mg (2.5 mg Intravenous Given 10/02/19 1325)  fentaNYL (SUBLIMAZE) injection 100 mcg (100 mcg Intravenous Given 10/02/19 1326)  propofol (DIPRIVAN) 10 mg/mL bolus/IV push (50 mg Intravenous Given 10/02/19 1406)    ED Course  I have reviewed the triage vital signs and the nursing notes.  Pertinent labs & imaging  results that were available during my care of the patient were reviewed by me and considered in my medical decision making (see chart for details).    MDM Rules/Calculators/A&P                      44 y/o F presenting to the ED for eval of jaw pain and dislocation that started PTA after the patient yawned.   Pt dislocated anteriorly bilaterally. No associated trauma therefore imaging felt not to be indicated.   Pt was consciously sedating and she underwent successful reduction of her jaw. She felt improved following reduction. Precautions discussed. Advised to f/u with ENT. Information for f/u given. Advised on return precautions. She voices understanding of the plan and reasons to return. All questions answered, pt stable for d/c. Marland Kitchen    Final Clinical Impression(s) / ED Diagnoses Final diagnoses:  Dislocation of temporomandibular joint, initial  encounter    Rx / DC Orders ED Discharge Orders         Ordered    diazepam (VALIUM) 2 MG tablet  At bedtime PRN     10/02/19 1516           Johnmatthew Solorio S, PA-C 10/02/19 1516    Virgina Norfolk, DO 10/03/19 1620

## 2019-10-02 NOTE — ED Notes (Signed)
Lockie Mola, MD and Cortni, PA at bedside.

## 2019-10-02 NOTE — Discharge Instructions (Addendum)
You may alternate taking Tylenol and Ibuprofen as needed for pain control. You may take 400-600 mg of ibuprofen every 6 hours and [phone] mg of Tylenol every 6 hours. Do not exceed 4000 mg of Tylenol daily as this can lead to liver damage. Also, make sure to take Ibuprofen with meals as it can cause an upset stomach. Do not take other NSAIDs while taking Ibuprofen such as (Aleve, Naprosyn, Aspirin, Celebrex, etc) and do not take more than the prescribed dose as this can lead to ulcers and bleeding in your GI tract. You may use warm and cold compresses to help with your symptoms.   Prescription given for Valium. Take medication as directed and do not operate machinery, drive a car, or work while taking this medication as it can make you drowsy.   Please follow up with your primary doctor within the next 7-10 days for re-evaluation and further treatment of your symptoms.   Please return to the ER sooner if you have any new or worsening symptoms.

## 2019-10-02 NOTE — ED Triage Notes (Signed)
Pt reports she yawned and her jaw popped out of place. Pt reports this has happened 2x before. Pt unwilling to answer triage questions due to pain. Pt insists she does not have TMJ.

## 2020-07-07 ENCOUNTER — Emergency Department (HOSPITAL_COMMUNITY): Payer: Self-pay

## 2020-07-07 ENCOUNTER — Emergency Department (HOSPITAL_COMMUNITY)
Admission: EM | Admit: 2020-07-07 | Discharge: 2020-07-07 | Disposition: A | Discharge status: Home / Self Care | Payer: Self-pay | Attending: Emergency Medicine | Admitting: Emergency Medicine

## 2020-07-07 ENCOUNTER — Other Ambulatory Visit: Payer: Self-pay

## 2020-07-07 ENCOUNTER — Encounter (HOSPITAL_COMMUNITY): Payer: Self-pay

## 2020-07-07 DIAGNOSIS — Z20822 Contact with and (suspected) exposure to covid-19: Secondary | ICD-10-CM | POA: Insufficient documentation

## 2020-07-07 DIAGNOSIS — Y92410 Unspecified street and highway as the place of occurrence of the external cause: Secondary | ICD-10-CM | POA: Insufficient documentation

## 2020-07-07 DIAGNOSIS — S0083XA Contusion of other part of head, initial encounter: Secondary | ICD-10-CM | POA: Insufficient documentation

## 2020-07-07 DIAGNOSIS — S0990XA Unspecified injury of head, initial encounter: Secondary | ICD-10-CM | POA: Insufficient documentation

## 2020-07-07 DIAGNOSIS — S199XXA Unspecified injury of neck, initial encounter: Secondary | ICD-10-CM | POA: Insufficient documentation

## 2020-07-07 DIAGNOSIS — T1490XA Injury, unspecified, initial encounter: Secondary | ICD-10-CM

## 2020-07-07 DIAGNOSIS — S32028A Other fracture of second lumbar vertebra, initial encounter for closed fracture: Secondary | ICD-10-CM | POA: Insufficient documentation

## 2020-07-07 DIAGNOSIS — F17210 Nicotine dependence, cigarettes, uncomplicated: Secondary | ICD-10-CM | POA: Insufficient documentation

## 2020-07-07 DIAGNOSIS — S32018A Other fracture of first lumbar vertebra, initial encounter for closed fracture: Secondary | ICD-10-CM | POA: Insufficient documentation

## 2020-07-07 DIAGNOSIS — S32009A Unspecified fracture of unspecified lumbar vertebra, initial encounter for closed fracture: Secondary | ICD-10-CM

## 2020-07-07 DIAGNOSIS — S32038A Other fracture of third lumbar vertebra, initial encounter for closed fracture: Secondary | ICD-10-CM | POA: Insufficient documentation

## 2020-07-07 LAB — CBC WITH DIFFERENTIAL/PLATELET
Abs Immature Granulocytes: 0.12 10*3/uL — ABNORMAL HIGH (ref 0.00–0.07)
Basophils Absolute: 0 10*3/uL (ref 0.0–0.1)
Basophils Relative: 0 %
Eosinophils Absolute: 0.1 10*3/uL (ref 0.0–0.5)
Eosinophils Relative: 1 %
HCT: 41.1 % (ref 36.0–46.0)
Hemoglobin: 13.6 g/dL (ref 12.0–15.0)
Immature Granulocytes: 1 %
Lymphocytes Relative: 7 %
Lymphs Abs: 1.2 10*3/uL (ref 0.7–4.0)
MCH: 31.4 pg (ref 26.0–34.0)
MCHC: 33.1 g/dL (ref 30.0–36.0)
MCV: 94.9 fL (ref 80.0–100.0)
Monocytes Absolute: 0.8 10*3/uL (ref 0.1–1.0)
Monocytes Relative: 5 %
Neutro Abs: 16 10*3/uL — ABNORMAL HIGH (ref 1.7–7.7)
Neutrophils Relative %: 86 %
Platelets: 275 10*3/uL (ref 150–400)
RBC: 4.33 MIL/uL (ref 3.87–5.11)
RDW: 12.6 % (ref 11.5–15.5)
WBC: 18.3 10*3/uL — ABNORMAL HIGH (ref 4.0–10.5)
nRBC: 0 % (ref 0.0–0.2)

## 2020-07-07 LAB — LACTIC ACID, PLASMA
Lactic Acid, Venous: 2 mmol/L (ref 0.5–1.9)
Lactic Acid, Venous: 1.2 mmol/L (ref 0.5–1.9)

## 2020-07-07 LAB — I-STAT BETA HCG BLOOD, ED (MC, WL, AP ONLY): I-stat hCG, quantitative: <5 m[IU]/mL (ref <5)

## 2020-07-07 LAB — COMPREHENSIVE METABOLIC PANEL
ALT: 29 U/L (ref 0–44)
AST: 36 U/L (ref 15–41)
Albumin: 4.4 g/dL (ref 3.5–5.0)
Alkaline Phosphatase: 47 U/L (ref 38–126)
Anion gap: 14 (ref 5–15)
BUN: 10 mg/dL (ref 6–20)
CO2: 26 mmol/L (ref 22–32)
Calcium: 9.4 mg/dL (ref 8.9–10.3)
Chloride: 102 mmol/L (ref 98–111)
Creatinine, Ser: 0.95 mg/dL (ref 0.44–1.00)
GFR, Estimated: >60 mL/min (ref >60)
Glucose, Bld: 104 mg/dL — ABNORMAL HIGH (ref 70–99)
Potassium: 3.3 mmol/L — ABNORMAL LOW (ref 3.5–5.1)
Sodium: 142 mmol/L (ref 135–145)
Total Bilirubin: 0.6 mg/dL (ref 0.3–1.2)
Total Protein: 7.7 g/dL (ref 6.5–8.1)

## 2020-07-07 LAB — PROTIME-INR
INR: 1 (ref 0.8–1.2)
Prothrombin Time: 13 seconds (ref 11.4–15.2)

## 2020-07-07 LAB — RESP PANEL BY RT-PCR (FLU A&B, COVID) ARPGX2
Influenza A by PCR: NEGATIVE
Influenza B by PCR: NEGATIVE
SARS Coronavirus 2 by RT PCR: NEGATIVE

## 2020-07-07 LAB — TYPE AND SCREEN
ABO/RH(D): A POS
Antibody Screen: NEGATIVE

## 2020-07-07 LAB — ETHANOL: Alcohol, Ethyl (B): <10 mg/dL (ref <10)

## 2020-07-07 MED ORDER — OXYCODONE-ACETAMINOPHEN 5-325 MG PO TABS
1.0000 | ORAL_TABLET | Freq: Once | ORAL | Status: AC
Start: 2020-07-07 — End: 2020-07-07
  Administered 2020-07-07: 1 via ORAL
  Filled 2020-07-07: qty 1

## 2020-07-07 MED ORDER — MORPHINE SULFATE (PF) 4 MG/ML IV SOLN
4.0000 mg | Freq: Once | INTRAVENOUS | Status: AC
Start: 2020-07-07 — End: 2020-07-07
  Administered 2020-07-07: 4 mg via INTRAVENOUS
  Filled 2020-07-07: qty 1

## 2020-07-07 MED ORDER — OXYCODONE-ACETAMINOPHEN 5-325 MG PO TABS: 1.0000 | ORAL_TABLET | Freq: Four times a day (QID) | ORAL | 0 refills | Status: AC | PRN

## 2020-07-07 MED ORDER — LORAZEPAM 2 MG/ML IJ SOLN
0.5000 mg | Freq: Once | INTRAMUSCULAR | Status: AC
  Administered 2020-07-07: 0.5 mg via INTRAVENOUS
  Filled 2020-07-07: qty 1

## 2020-07-07 MED ORDER — IOHEXOL 300 MG/ML  SOLN
80.0000 mL | Freq: Once | INTRAMUSCULAR | Status: AC | PRN
  Administered 2020-07-07: 80 mL via INTRAVENOUS

## 2020-07-07 MED ORDER — MORPHINE SULFATE (PF) 4 MG/ML IV SOLN
4.0000 mg | Freq: Once | INTRAVENOUS | Status: AC
  Administered 2020-07-07: 4 mg via INTRAVENOUS
  Filled 2020-07-07: qty 1
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
## 2020-07-07 NOTE — ED Notes (Signed)
Pt to CT scan.

## 2020-07-07 NOTE — ED Notes (Signed)
Pt refusing purewick, pt refused bedpan, requested female urinal. Pt able to urinate in urinal. Urine set to side, when staff went to collect urine, urine found in floor. Pt and family member at bedside aware urine sample needed.
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
## 2020-07-07 NOTE — Discharge Instructions (Signed)
Please return for any problem. Follow-up with [facility] as instructed for further treatment of your lumbar spine injury.

## 2020-07-07 NOTE — ED Notes (Signed)
c-collar removed by MD.

## 2021-01-23 ENCOUNTER — Emergency Department (HOSPITAL_COMMUNITY)
Admission: EM | Admit: 2021-01-23 | Discharge: 2021-01-23 | Disposition: A | Discharge status: Home / Self Care | Payer: Medicaid Other

## 2021-01-23 ENCOUNTER — Emergency Department (HOSPITAL_COMMUNITY): Payer: Medicaid Other

## 2021-01-23 ENCOUNTER — Other Ambulatory Visit: Payer: Self-pay

## 2021-01-23 ENCOUNTER — Emergency Department (HOSPITAL_COMMUNITY)
Admission: EM | Admit: 2021-01-23 | Discharge: 2021-01-23 | Disposition: A | Discharge status: Home / Self Care | Payer: Medicaid Other | Attending: Emergency Medicine | Admitting: Emergency Medicine

## 2021-01-23 ENCOUNTER — Encounter (HOSPITAL_COMMUNITY): Payer: Self-pay | Admitting: Emergency Medicine

## 2021-01-23 DIAGNOSIS — F17210 Nicotine dependence, cigarettes, uncomplicated: Secondary | ICD-10-CM | POA: Insufficient documentation

## 2021-01-23 DIAGNOSIS — I10 Essential (primary) hypertension: Secondary | ICD-10-CM | POA: Insufficient documentation

## 2021-01-23 DIAGNOSIS — Z23 Encounter for immunization: Secondary | ICD-10-CM | POA: Insufficient documentation

## 2021-01-23 DIAGNOSIS — Z79899 Other long term (current) drug therapy: Secondary | ICD-10-CM | POA: Insufficient documentation

## 2021-01-23 DIAGNOSIS — L03116 Cellulitis of left lower limb: Secondary | ICD-10-CM | POA: Insufficient documentation

## 2021-01-23 LAB — CBC WITH DIFFERENTIAL/PLATELET
Abs Immature Granulocytes: 0.04 10*3/uL (ref 0.00–0.07)
Basophils Absolute: 0 10*3/uL (ref 0.0–0.1)
Basophils Relative: 0 %
Eosinophils Absolute: 0.2 10*3/uL (ref 0.0–0.5)
Eosinophils Relative: 2 %
HCT: 37 % (ref 36.0–46.0)
Hemoglobin: 12.3 g/dL (ref 12.0–15.0)
Immature Granulocytes: 0 %
Lymphocytes Relative: 11 %
Lymphs Abs: 1.1 10*3/uL (ref 0.7–4.0)
MCH: 31.4 pg (ref 26.0–34.0)
MCHC: 33.2 g/dL (ref 30.0–36.0)
MCV: 94.4 fL (ref 80.0–100.0)
Monocytes Absolute: 0.7 10*3/uL (ref 0.1–1.0)
Monocytes Relative: 7 %
Neutro Abs: 7.9 10*3/uL — ABNORMAL HIGH (ref 1.7–7.7)
Neutrophils Relative %: 80 %
Platelets: 268 10*3/uL (ref 150–400)
RBC: 3.92 MIL/uL (ref 3.87–5.11)
RDW: 13.1 % (ref 11.5–15.5)
WBC: 10.1 10*3/uL (ref 4.0–10.5)
nRBC: 0 % (ref 0.0–0.2)

## 2021-01-23 LAB — BASIC METABOLIC PANEL
Anion gap: 5 (ref 5–15)
BUN: 13 mg/dL (ref 6–20)
CO2: 28 mmol/L (ref 22–32)
Calcium: 9.4 mg/dL (ref 8.9–10.3)
Chloride: 106 mmol/L (ref 98–111)
Creatinine, Ser: 0.83 mg/dL (ref 0.44–1.00)
GFR, Estimated: >60 mL/min (ref >60)
Glucose, Bld: 118 mg/dL — ABNORMAL HIGH (ref 70–99)
Potassium: 3.6 mmol/L (ref 3.5–5.1)
Sodium: 139 mmol/L (ref 135–145)

## 2021-01-23 MED ORDER — TETANUS-DIPHTH-ACELL PERTUSSIS 5-2.5-18.5 LF-MCG/0.5 IM SUSY
0.5000 mL | PREFILLED_SYRINGE | Freq: Once | INTRAMUSCULAR | Status: AC
  Administered 2021-01-23: 19:00:00 0.5 mL via INTRAMUSCULAR
  Filled 2021-01-23: qty 0.5

## 2021-01-23 MED ORDER — BACITRACIN ZINC 500 UNIT/GM EX OINT
TOPICAL_OINTMENT | Freq: Two times a day (BID) | CUTANEOUS | Status: DC
  Administered 2021-01-23: 1 via TOPICAL
  Filled 2021-01-23: qty 2.7

## 2021-01-23 MED ORDER — IBUPROFEN 800 MG PO TABS
800.0000 mg | ORAL_TABLET | Freq: Once | ORAL | Status: AC
  Administered 2021-01-23: 18:00:00 800 mg via ORAL
  Filled 2021-01-23: qty 1

## 2021-01-23 MED ORDER — DOXYCYCLINE HYCLATE 100 MG PO TABS
100.0000 mg | ORAL_TABLET | Freq: Once | ORAL | Status: AC
  Administered 2021-01-23: 18:00:00 100 mg via ORAL
  Filled 2021-01-23: qty 1

## 2021-01-23 MED ORDER — DOXYCYCLINE HYCLATE 100 MG PO CAPS: 100.0000 mg | ORAL_CAPSULE | Freq: Two times a day (BID) | ORAL | 0 refills | Status: AC

## 2021-01-23 NOTE — ED Provider Notes (Signed)
Emergency Medicine Provider Triage Evaluation Note  Sheena Gross , a 45 y.o. female  was evaluated in triage.  Pt complains of a wound to her left lower leg.  Patient states that she noticed some swelling to her left calf about 2 days ago.  She states that she then developed burning in her lower legs and hands.  She noticed a ulcer which developed yesterday.  Patient arrived very anxious, crying.  Denies any fevers or chills.  Inks this may have been a spider bite, though she did not see anything bite her.  Review of Systems  Positive: As above Negative: As above  Physical Exam  There were no vitals taken for this visit. Gen:   Awake, anxious, tearful Resp:  Normal effort  MSK:   Moves extremities without difficulty  Other:  Visible ulceration to the posterior calf with some surrounding erythema.  2+ DP pulses.  Medical Decision Making  Medically screening exam initiated at 3:35 PM.  Appropriate orders placed.  Elexius G Blumstein was informed that the remainder of the evaluation will be completed by another provider, this initial triage assessment does not replace that evaluation, and the importance of remaining in the ED until their evaluation is complete.     Mare Ferrari, PA-C 01/23/21 1538    Milagros Loll, MD 01/24/21 [phone]

## 2021-01-23 NOTE — ED Provider Notes (Signed)
[facility]-EMERGENCY DEPT Provider Note   CSN: [id number] Arrival date & time: 01/23/21  1514     History Chief Complaint  Patient presents with   Leg Pain    Sheena Gross is a 45 y.o. female who presents with concern for 4 days of right calf redness and pain now with 2 days of wounds to the posterior calf.  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  States that she denies any fevers or chills, nausea, vomiting, but is extremely anxious, tearful in the exam room.  Denies any numbness, ting, weakness in her foot but does endorse increased bilateral lower extremity swelling times a few weeks since the  heat has increased.  I personally reviewed this patient medical records.  Has history of anxiety and hypertension, is not on any anticoagulation.  Is not immunocompromise.  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 No pertinent surgical history.   OB History   No obstetric history on file.     No family history on file.  Social History   Tobacco Use   Smoking status: Some Days    Packs/day: 0.50    Pack years: 0.00    Types: Cigarettes    Last attempt to quit: 10/29/2012    Years since quitting: 8.2  Vaping Use   Vaping Use: Never used  Substance Use Topics   Alcohol use: No   Drug use: No    Home Medications Prior to Admission medications   Medication Sig Start Date End Date Taking? Authorizing Provider  doxycycline (VIBRAMYCIN) 100 MG capsule Take 1 capsule (100 mg total) by mouth 2 (two) times daily for 10 days. 01/23/21 02/02/21 Yes Lillybeth Tal, Eugene Gavia, PA-C  ALPRAZolam (XANAX) 1 MG tablet Take 1 mg by mouth 3 (three) times daily.    [provider]  ALPRAZolam Prudy Feeler) 1 MG tablet Take 1 tablet (1 mg total) by  mouth at bedtime as needed for anxiety. 12/29/13   Elpidio Anis, PA-C  cyclobenzaprine (FLEXERIL) 10 MG tablet Take 1 tablet (10 mg total) by mouth 2 (two) times daily as needed for muscle spasms. 12/29/13   Elpidio Anis, PA-C  diazepam (VALIUM) 2 MG tablet Take 1 tablet (2 mg total) by mouth at bedtime as needed for up to 5 doses for anxiety. 10/02/19   Couture, Cortni S, PA-C  ibuprofen (ADVIL,MOTRIN) 800 MG tablet Take 1 tablet (800 mg total) by mouth 3 (three) times daily. 12/29/13   Elpidio Anis, PA-C  lisinopril (PRINIVIL,ZESTRIL) 20 MG tablet Take 1 tablet (20 mg total) by mouth daily. 11/25/13   Lurene Shadow, PA-C  Multiple Vitamin (MULTIVITAMIN WITH MINERALS) TABS tablet Take 1 tablet by mouth daily.    [provider]  oxyCODONE-acetaminophen (PERCOCET/ROXICET) 5-325 MG tablet Take 1 tablet by mouth every 6 (six) hours as needed for severe pain. 07/07/20   Wynetta Fines, MD    Allergies    Codeine  Review of Systems   Review of Systems  Constitutional: Negative.   HENT: Negative.    Respiratory:  Positive for chest tightness. Negative for cough and shortness of breath.   Cardiovascular: Negative.   Gastrointestinal: Negative.   Musculoskeletal: Negative.   Skin:  Positive for wound.  Neurological: Negative.   Psychiatric/Behavioral:  The patient is nervous/anxious.    Physical Exam Updated Vital Signs BP (!) 164/85 (BP Location: Right Arm)   Pulse 83   Temp 98.6 F (37 C) (Oral)   Resp 18   LMP 01/23/2021   SpO2 100%   Physical Exam Vitals and nursing note reviewed.  Constitutional:      Appearance: She is not ill-appearing or toxic-appearing.  HENT:     Head: Normocephalic and atraumatic.     Mouth/Throat:     Mouth: Mucous membranes are moist.     Pharynx: Oropharynx is clear. Uvula midline. No oropharyngeal exudate, posterior oropharyngeal erythema or uvula swelling.     Tonsils: No tonsillar exudate.  Eyes:     General: Lids are normal. Vision  grossly intact.        Right eye: No discharge.        Left eye: No discharge.     Extraocular Movements: Extraocular movements intact.     Conjunctiva/sclera: Conjunctivae normal.     Pupils: Pupils are equal, round, and reactive to light.  Neck:     Trachea: Trachea and phonation normal.  Cardiovascular:     Rate and Rhythm: Normal rate and regular rhythm.     Pulses: Normal pulses.     Heart sounds: Normal heart sounds. No murmur heard. Pulmonary:     Effort: Pulmonary effort is normal. No tachypnea, bradypnea, accessory muscle usage, prolonged expiration or respiratory distress.     Breath sounds: Normal breath sounds. No wheezing or rales.  Chest:     Chest wall: No mass, lacerations, deformity, swelling, tenderness, crepitus or edema.  Abdominal:     General: Bowel sounds are normal. There is no distension.     Palpations: Abdomen is soft.     Tenderness: There is no abdominal tenderness. There is no guarding or rebound.     Hernia: A hernia is present. Hernia is present in the ventral area.  Musculoskeletal:        General: No deformity.     Cervical back: Normal range of motion and neck supple.     Right lower leg: No edema.     Left lower leg: No edema.     Comments: No lower extremity edema at this time, 2+ pedal pulses bilaterally.  Full range of motion of the knee, ankle, feet.  Lymphadenopathy:     Cervical: No cervical adenopathy.  Skin:    General: Skin is warm and dry.     Capillary Refill: Capillary refill takes less than 2 seconds.       Neurological:     General: No focal deficit present.     Mental Status: She is alert and oriented to person, place, and time. Mental status is at baseline.  Psychiatric:        Mood and Affect: Mood normal.     ED Results / Procedures / Treatments   Labs (all labs ordered are listed, but only abnormal results are displayed) Labs Reviewed  CBC WITH DIFFERENTIAL/PLATELET - Abnormal; Notable for the following components:       Result Value   Neutro Abs 7.9 (*)    All other components within normal limits  BASIC METABOLIC PANEL - Abnormal; Notable for the following components:   Glucose, Bld 118 (*)    All other components within normal limits    EKG EKG: normal EKG, normal sinus rhythm, no STEMI.   Radiology DG Tibia/Fibula Left  Result Date: 01/23/2021  CLINICAL DATA:  Soft tissue wound EXAM: LEFT TIBIA AND FIBULA - 2 VIEW COMPARISON:  None. FINDINGS: Frontal and lateral views were obtained. No fracture or dislocation. No abnormal periosteal reaction. No bony destruction or erosion. Joint spaces appear normal. No soft tissue air or radiopaque foreign body evident. IMPRESSION: No evidence soft tissue air or radiopaque foreign body. No fracture or dislocation. No bony destruction or erosion. Electronically Signed   By: Bretta Bang III M.D.   On: 01/23/2021 17:00    Procedures Procedures   Medications Ordered in ED Medications  Tdap (BOOSTRIX) injection 0.5 mL (has no administration in time range)  doxycycline (VIBRA-TABS) tablet 100 mg (100 mg Oral Given 01/23/21 1811)  ibuprofen (ADVIL) tablet 800 mg (800 mg Oral Given 01/23/21 1811)    ED Course  I have reviewed the triage vital signs and the nursing notes.  Pertinent labs & imaging results that were available during my care of the patient were reviewed by me and considered in my medical decision making (see chart for details).    MDM Rules/Calculators/A&P                         45 year old female presents with concern for ulceration and left calf pain x2 days, no known specific known injury, however she has recurring exposures to the outdoors and old homes.  Differential diagnosis includes is not limited to cellulitis, skin abscess, necrotizing soft tissue infection, insect bite.   Hypertensive on intake, vital signs otherwise normal.  Cardiopulmonary exam is normal abdominal exam is benign.  Patient is extremely anxious, tearful.  There  is ulceration to the left posterior calf as above with surrounding erythema without induration, crepitus, or streaking of the redness up or down the leg.  2+ pedal pulses bilaterally without lower extremity edema.  Basic labs obtained in triage.  CBC unremarkable, BMP unremarkable.  EKG reassuring, and was obtained due to patient's concern for persistent chest tightness.  Physical exam most consistent with cellulitis with ulceration but without abscess.  Will administer first dose of antibiotics in the emergency department and discharged with course of antibiotics at home and close outpatient follow-up.  Given reassuring physical exam, vital signs, blood work, and lack of systemic symptoms, no further work-up is warranted in ED at this time. Tetanus updated in the ED today.   Sheena Gross voiced understanding of her medical evaluation and treatment plan.  Each of her questions was answered to her expressed inspection.  Return precautions given.  Patient stable and appropriate for discharge at this time.  This chart was dictated using voice recognition software, Dragon. Despite the best efforts of this provider to proofread and correct errors, errors may still occur which can change documentation meaning.  Final Clinical Impression(s) / ED Diagnoses Final diagnoses:  Cellulitis of left lower extremity    Rx / DC Orders ED Discharge Orders          Ordered    doxycycline (VIBRAMYCIN) 100 MG capsule  2 times daily        01/23/21 [id number], Eugene Gavia, PA-C 01/23/21 1816    Virgina Norfolk, DO 01/23/21 1929

## 2021-01-23 NOTE — ED Triage Notes (Signed)
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

## 2021-01-23 NOTE — Discharge Instructions (Addendum)
You were seen in the ER today for your left leg pain and wound.  Your physical exam and vital signs are very reassuring.  You do have an infection in your leg called cellulitis.  You have been started on antibiotic for this which take twice a day for the next 10 days as prescribed.  Please take entire antibiotic course as prescribed.  Please be aware that this antibiotic may make you more sensitive to the sun.  May follow-up in the outpatient setting with the [facility] listed below, you should return to the emergency department sooner if you develop any extension of the redness from the wound up or down your leg, nausea, vomiting, fevers, chills despite greater than 24 hours on antibiotics, or any other new severe symptoms.

## 2022-06-28 DIAGNOSIS — Z419 Encounter for procedure for purposes other than remedying health state, unspecified: Secondary | ICD-10-CM | POA: Diagnosis not present
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
## 2022-07-29 DIAGNOSIS — Z419 Encounter for procedure for purposes other than remedying health state, unspecified: Secondary | ICD-10-CM | POA: Diagnosis not present

## 2022-08-10 DIAGNOSIS — K921 Melena: Secondary | ICD-10-CM | POA: Diagnosis not present

## 2022-08-10 DIAGNOSIS — K429 Umbilical hernia without obstruction or gangrene: Secondary | ICD-10-CM | POA: Diagnosis not present

## 2022-08-10 DIAGNOSIS — R1033 Periumbilical pain: Secondary | ICD-10-CM | POA: Diagnosis not present

## 2022-08-10 DIAGNOSIS — Z1211 Encounter for screening for malignant neoplasm of colon: Secondary | ICD-10-CM | POA: Diagnosis not present

## 2022-08-10 DIAGNOSIS — K5909 Other constipation: Secondary | ICD-10-CM | POA: Diagnosis not present

## 2022-08-12 DIAGNOSIS — M25562 Pain in left knee: Secondary | ICD-10-CM | POA: Diagnosis not present

## 2022-08-12 DIAGNOSIS — S83512A Sprain of anterior cruciate ligament of left knee, initial encounter: Secondary | ICD-10-CM | POA: Diagnosis not present

## 2022-08-22 ENCOUNTER — Ambulatory Visit: Payer: Medicaid Other | Admitting: Family Medicine

## 2022-08-22 DIAGNOSIS — M25562 Pain in left knee: Secondary | ICD-10-CM | POA: Diagnosis not present

## 2022-08-29 DIAGNOSIS — Z419 Encounter for procedure for purposes other than remedying health state, unspecified: Secondary | ICD-10-CM | POA: Diagnosis not present

## 2022-09-27 DIAGNOSIS — Z419 Encounter for procedure for purposes other than remedying health state, unspecified: Secondary | ICD-10-CM | POA: Diagnosis not present

## 2022-10-07 DIAGNOSIS — Z1211 Encounter for screening for malignant neoplasm of colon: Secondary | ICD-10-CM | POA: Diagnosis not present

## 2022-10-07 DIAGNOSIS — K6289 Other specified diseases of anus and rectum: Secondary | ICD-10-CM | POA: Diagnosis not present

## 2022-10-07 DIAGNOSIS — K635 Polyp of colon: Secondary | ICD-10-CM | POA: Diagnosis not present

## 2022-10-08 ENCOUNTER — Telehealth: Payer: Self-pay

## 2022-10-08 NOTE — Telephone Encounter (Signed)
Mychart msg sent

## 2022-10-10 DIAGNOSIS — C44520 Squamous cell carcinoma of anal skin: Secondary | ICD-10-CM | POA: Diagnosis not present

## 2022-10-25 DIAGNOSIS — C210 Malignant neoplasm of anus, unspecified: Secondary | ICD-10-CM | POA: Diagnosis not present

## 2022-10-31 DIAGNOSIS — C210 Malignant neoplasm of anus, unspecified: Secondary | ICD-10-CM | POA: Diagnosis not present

## 2022-11-06 DIAGNOSIS — C20 Malignant neoplasm of rectum: Secondary | ICD-10-CM | POA: Diagnosis not present

## 2022-11-07 DIAGNOSIS — C20 Malignant neoplasm of rectum: Secondary | ICD-10-CM | POA: Diagnosis not present

## 2023-01-20 IMAGING — CR DG TIBIA/FIBULA 2V*L*
2 series · 2 of 2 positions shown · non-contrast
Comparison: None.

CLINICAL DATA: Soft tissue wound

EXAM:
LEFT TIBIA AND FIBULA - 2 VIEW

[x tib-fib ap left]
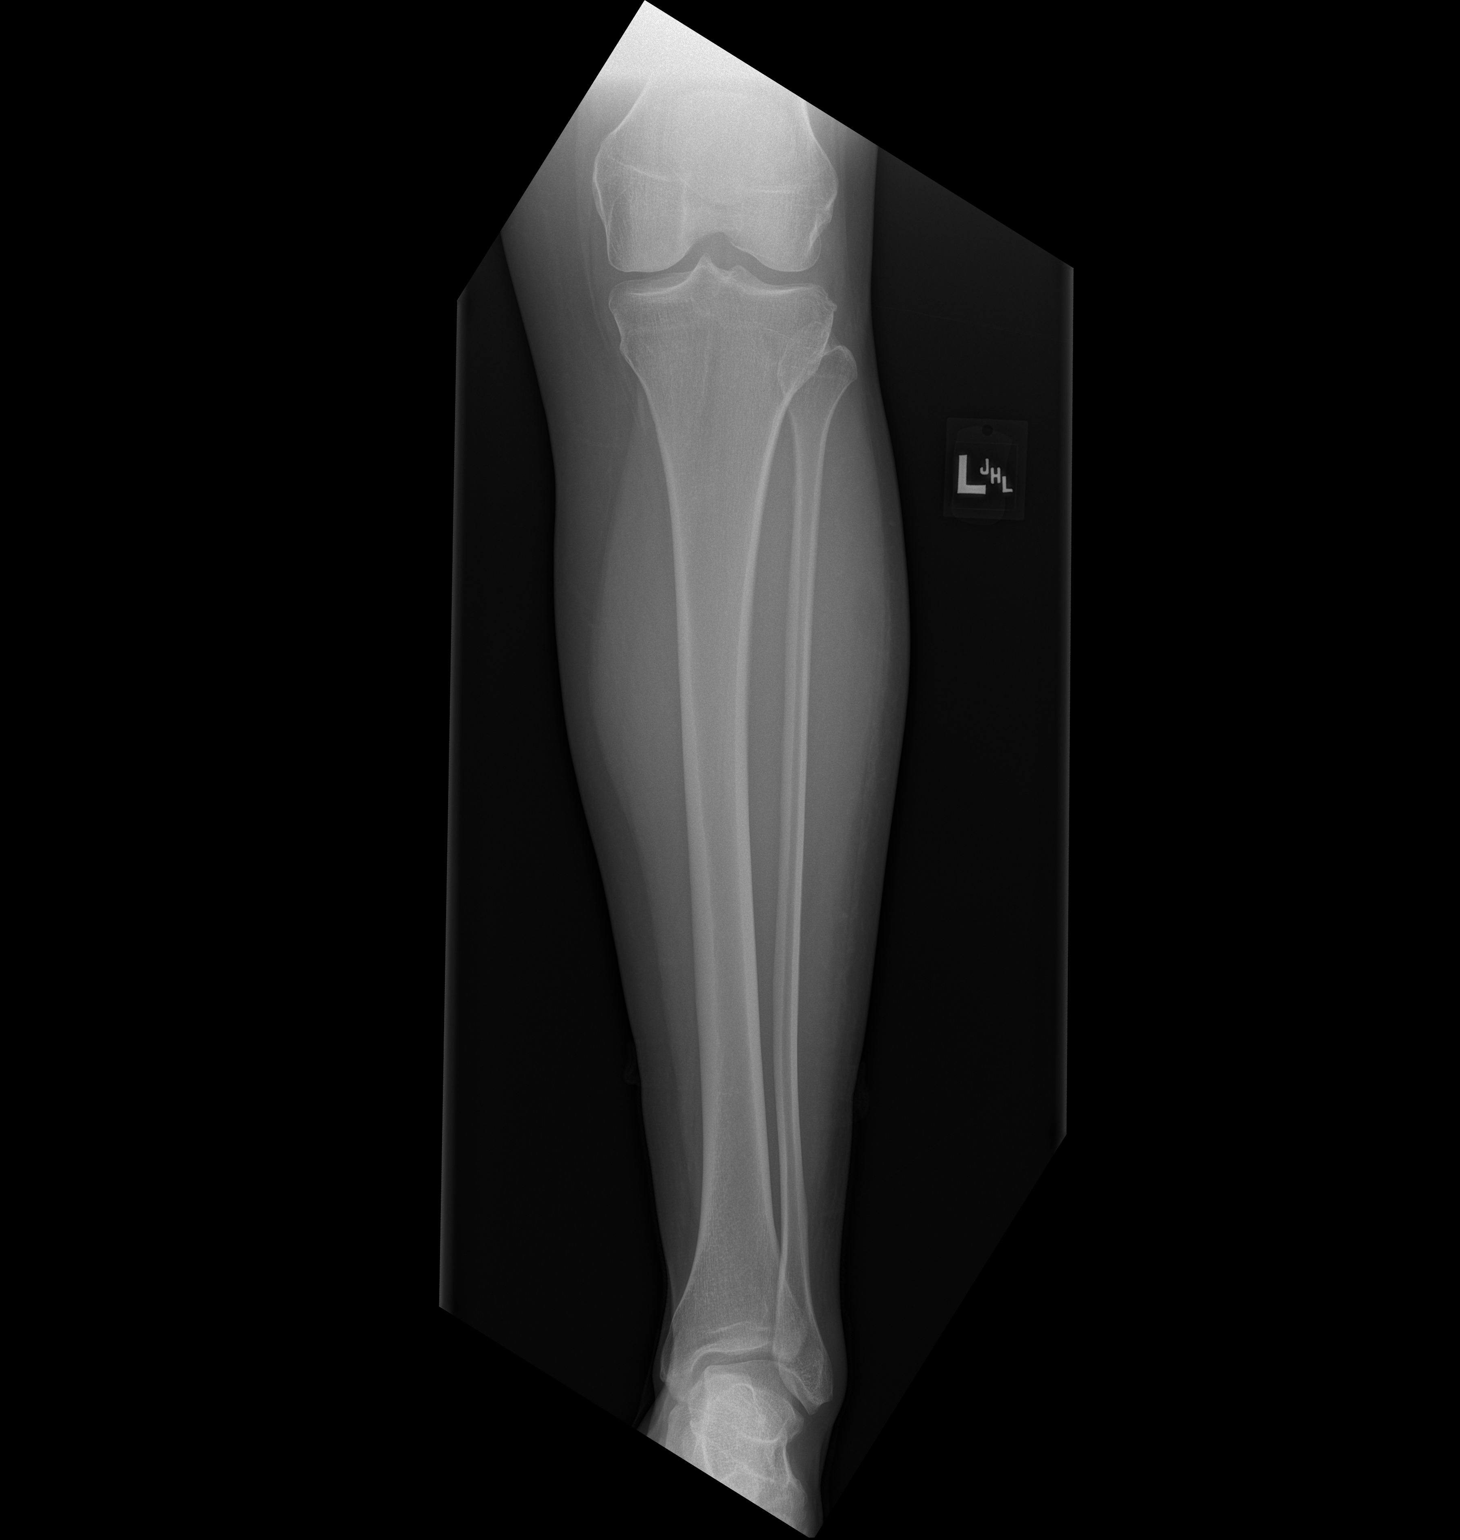

[x tib-fib lat left]
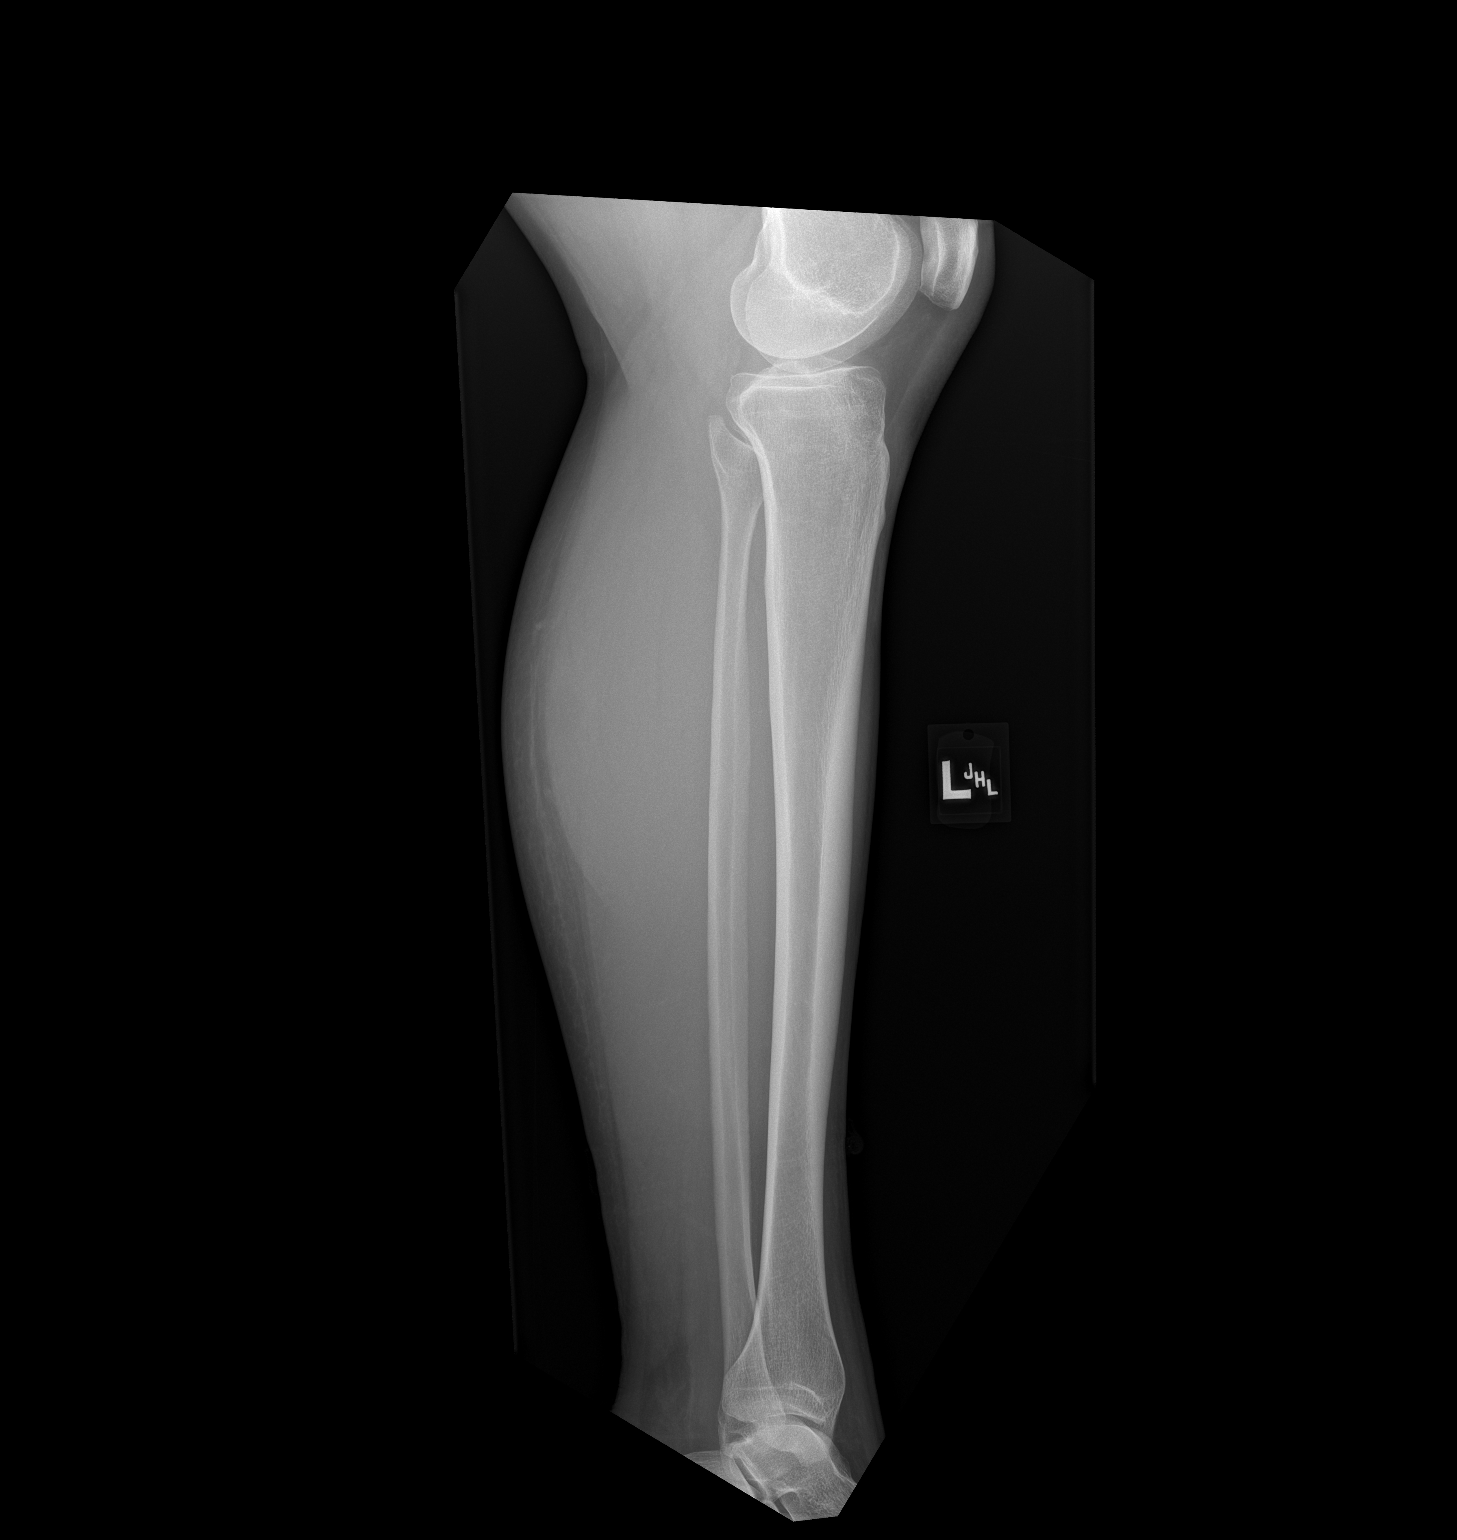

[2 of 2 positions shown; findings below may reference images not displayed]

FINDINGS: Frontal and lateral views were obtained. No fracture or dislocation.
No abnormal periosteal reaction. No bony destruction or erosion.
Joint spaces appear normal. No soft tissue air or radiopaque foreign
body evident.
IMPRESSION: No evidence soft tissue air or radiopaque foreign body. No fracture
or dislocation. No bony destruction or erosion.
